# Patient Record
Sex: Male | Born: 1935 | Race: White | Hispanic: No | Marital: Married | Smoking: Former smoker
Health system: Southern US, Community
[De-identification: ages and names within clinical notes are randomized; demographics above are authoritative.]

## PROBLEM LIST (undated history)

## (undated) DIAGNOSIS — I350 Nonrheumatic aortic (valve) stenosis: Secondary | ICD-10-CM

## (undated) DIAGNOSIS — T7840XA Allergy, unspecified, initial encounter: Secondary | ICD-10-CM

## (undated) DIAGNOSIS — I10 Essential (primary) hypertension: Secondary | ICD-10-CM

## (undated) DIAGNOSIS — G20 Parkinson's disease: Secondary | ICD-10-CM

## (undated) DIAGNOSIS — I119 Hypertensive heart disease without heart failure: Secondary | ICD-10-CM

## (undated) DIAGNOSIS — I270 Primary pulmonary hypertension: Secondary | ICD-10-CM

## (undated) DIAGNOSIS — R269 Unspecified abnormalities of gait and mobility: Secondary | ICD-10-CM

## (undated) DIAGNOSIS — H269 Unspecified cataract: Secondary | ICD-10-CM

## (undated) DIAGNOSIS — K5730 Diverticulosis of large intestine without perforation or abscess without bleeding: Secondary | ICD-10-CM

## (undated) DIAGNOSIS — R55 Syncope and collapse: Secondary | ICD-10-CM

## (undated) DIAGNOSIS — C801 Malignant (primary) neoplasm, unspecified: Secondary | ICD-10-CM

## (undated) DIAGNOSIS — I2510 Atherosclerotic heart disease of native coronary artery without angina pectoris: Secondary | ICD-10-CM

## (undated) DIAGNOSIS — E785 Hyperlipidemia, unspecified: Secondary | ICD-10-CM

## (undated) DIAGNOSIS — Z9889 Other specified postprocedural states: Secondary | ICD-10-CM

## (undated) DIAGNOSIS — Z5189 Encounter for other specified aftercare: Secondary | ICD-10-CM

## (undated) DIAGNOSIS — J189 Pneumonia, unspecified organism: Secondary | ICD-10-CM

## (undated) DIAGNOSIS — G20C Parkinsonism, unspecified: Secondary | ICD-10-CM

## (undated) DIAGNOSIS — K635 Polyp of colon: Secondary | ICD-10-CM

## (undated) DIAGNOSIS — M1990 Unspecified osteoarthritis, unspecified site: Secondary | ICD-10-CM

## (undated) HISTORY — DX: Atherosclerotic heart disease of native coronary artery without angina pectoris: I25.10

## (undated) HISTORY — PX: EYE SURGERY: SHX253

## (undated) HISTORY — DX: Primary pulmonary hypertension: I27.0

## (undated) HISTORY — PX: CORONARY ARTERY BYPASS GRAFT: SHX141

## (undated) HISTORY — PX: PTCA: SHX146

## (undated) HISTORY — DX: Unspecified cataract: H26.9

## (undated) HISTORY — DX: Parkinsonism, unspecified: G20.C

## (undated) HISTORY — DX: Unspecified abnormalities of gait and mobility: R26.9

## (undated) HISTORY — DX: Encounter for other specified aftercare: Z51.89

## (undated) HISTORY — DX: Nonrheumatic aortic (valve) stenosis: I35.0

## (undated) HISTORY — DX: Diverticulosis of large intestine without perforation or abscess without bleeding: K57.30

## (undated) HISTORY — DX: Polyp of colon: K63.5

## (undated) HISTORY — DX: Syncope and collapse: R55

## (undated) HISTORY — DX: Essential (primary) hypertension: I10

## (undated) HISTORY — DX: Hyperlipidemia, unspecified: E78.5

## (undated) HISTORY — DX: Other specified postprocedural states: Z98.890

## (undated) HISTORY — DX: Parkinson's disease: G20

## (undated) HISTORY — DX: Hypertensive heart disease without heart failure: I11.9

## (undated) HISTORY — PX: LAPAROSCOPIC CHOLECYSTECTOMY: SUR755

## (undated) HISTORY — DX: Allergy, unspecified, initial encounter: T78.40XA

---

## 1978-05-14 HISTORY — PX: CORONARY ARTERY BYPASS GRAFT: SHX141

## 1997-08-12 ENCOUNTER — Encounter (HOSPITAL_COMMUNITY): Admission: RE | Admit: 1997-08-12 | Discharge: 1997-11-10 | Payer: Self-pay | Admitting: Cardiology

## 1997-11-12 ENCOUNTER — Encounter (HOSPITAL_COMMUNITY): Admission: RE | Admit: 1997-11-12 | Discharge: 1998-02-10 | Payer: Self-pay | Admitting: Cardiology

## 1997-12-06 ENCOUNTER — Observation Stay (HOSPITAL_COMMUNITY): Admission: AD | Admit: 1997-12-06 | Discharge: 1997-12-07 | Payer: Self-pay | Admitting: Cardiology

## 1998-02-11 ENCOUNTER — Encounter (HOSPITAL_COMMUNITY): Admission: RE | Admit: 1998-02-11 | Discharge: 1998-05-12 | Payer: Self-pay | Admitting: Cardiology

## 1998-05-13 ENCOUNTER — Encounter (HOSPITAL_COMMUNITY): Admission: RE | Admit: 1998-05-13 | Discharge: 1998-08-11 | Payer: Self-pay | Admitting: Cardiology

## 1998-08-12 ENCOUNTER — Encounter (HOSPITAL_COMMUNITY): Admission: RE | Admit: 1998-08-12 | Discharge: 1998-11-10 | Payer: Self-pay | Admitting: Cardiology

## 1998-11-11 ENCOUNTER — Encounter (HOSPITAL_COMMUNITY): Admission: RE | Admit: 1998-11-11 | Discharge: 1998-12-12 | Payer: Self-pay | Admitting: Cardiology

## 1998-12-13 ENCOUNTER — Encounter (HOSPITAL_COMMUNITY): Admission: RE | Admit: 1998-12-13 | Discharge: 1999-03-13 | Payer: Self-pay | Admitting: Cardiology

## 1998-12-28 ENCOUNTER — Encounter: Admission: RE | Admit: 1998-12-28 | Discharge: 1999-03-28 | Payer: Self-pay | Admitting: Orthopedic Surgery

## 2000-06-18 ENCOUNTER — Ambulatory Visit (HOSPITAL_BASED_OUTPATIENT_CLINIC_OR_DEPARTMENT_OTHER): Admission: RE | Admit: 2000-06-18 | Discharge: 2000-06-18 | Payer: Self-pay | Admitting: General Surgery

## 2000-06-18 ENCOUNTER — Encounter (INDEPENDENT_AMBULATORY_CARE_PROVIDER_SITE_OTHER): Payer: Self-pay | Admitting: Specialist

## 2003-09-13 ENCOUNTER — Ambulatory Visit (HOSPITAL_COMMUNITY): Admission: RE | Admit: 2003-09-13 | Discharge: 2003-09-13 | Payer: Self-pay | Admitting: Internal Medicine

## 2005-03-27 ENCOUNTER — Ambulatory Visit: Payer: Self-pay

## 2005-03-27 ENCOUNTER — Ambulatory Visit: Payer: Self-pay | Admitting: Cardiology

## 2005-04-11 ENCOUNTER — Ambulatory Visit: Payer: Self-pay | Admitting: Cardiology

## 2005-05-23 ENCOUNTER — Ambulatory Visit: Payer: Self-pay | Admitting: Cardiology

## 2005-08-30 ENCOUNTER — Ambulatory Visit: Payer: Self-pay | Admitting: Internal Medicine

## 2005-09-12 ENCOUNTER — Ambulatory Visit: Payer: Self-pay | Admitting: Internal Medicine

## 2005-09-12 ENCOUNTER — Encounter (INDEPENDENT_AMBULATORY_CARE_PROVIDER_SITE_OTHER): Payer: Self-pay | Admitting: Specialist

## 2006-04-17 ENCOUNTER — Ambulatory Visit: Payer: Self-pay | Admitting: Cardiology

## 2006-04-17 ENCOUNTER — Ambulatory Visit: Payer: Self-pay

## 2006-05-23 ENCOUNTER — Ambulatory Visit: Payer: Self-pay | Admitting: Cardiology

## 2007-06-02 ENCOUNTER — Ambulatory Visit: Payer: Self-pay

## 2007-06-02 LAB — CONVERTED CEMR LAB
AST: 23 units/L (ref 0–37)
Alkaline Phosphatase: 84 units/L (ref 39–117)
BUN: 29 mg/dL — ABNORMAL HIGH (ref 6–23)
Bilirubin, Direct: 0.3 mg/dL (ref 0.0–0.3)
CO2: 29 meq/L (ref 19–32)
Cholesterol: 109 mg/dL (ref 0–200)
Creatinine, Ser: 1.3 mg/dL (ref 0.4–1.5)
GFR calc Af Amer: 68 mL/min
HDL: 36.1 mg/dL — ABNORMAL LOW (ref >39.0)
Potassium: 4.6 meq/L (ref 3.5–5.1)
Sodium: 140 meq/L (ref 135–145)

## 2007-06-09 ENCOUNTER — Ambulatory Visit: Payer: Self-pay | Admitting: Cardiology

## 2007-06-16 ENCOUNTER — Ambulatory Visit: Payer: Self-pay

## 2007-06-16 ENCOUNTER — Encounter: Payer: Self-pay | Admitting: Cardiology

## 2008-01-07 ENCOUNTER — Ambulatory Visit: Payer: Self-pay | Admitting: Cardiology

## 2008-02-10 ENCOUNTER — Ambulatory Visit: Payer: Self-pay | Admitting: Cardiology

## 2008-02-24 ENCOUNTER — Ambulatory Visit: Payer: Self-pay | Admitting: Cardiology

## 2008-08-23 ENCOUNTER — Telehealth: Payer: Self-pay | Admitting: Cardiology

## 2008-08-24 ENCOUNTER — Telehealth (INDEPENDENT_AMBULATORY_CARE_PROVIDER_SITE_OTHER): Payer: Self-pay | Admitting: *Deleted

## 2008-09-13 ENCOUNTER — Telehealth: Payer: Self-pay | Admitting: Cardiology

## 2008-09-15 ENCOUNTER — Telehealth: Payer: Self-pay | Admitting: Cardiology

## 2008-09-22 ENCOUNTER — Telehealth (INDEPENDENT_AMBULATORY_CARE_PROVIDER_SITE_OTHER): Payer: Self-pay | Admitting: *Deleted

## 2008-09-23 ENCOUNTER — Ambulatory Visit: Payer: Self-pay

## 2008-09-23 ENCOUNTER — Ambulatory Visit: Payer: Self-pay | Admitting: Cardiology

## 2008-09-23 DIAGNOSIS — I119 Hypertensive heart disease without heart failure: Secondary | ICD-10-CM | POA: Insufficient documentation

## 2008-09-23 DIAGNOSIS — I272 Pulmonary hypertension, unspecified: Secondary | ICD-10-CM | POA: Insufficient documentation

## 2008-09-23 LAB — CONVERTED CEMR LAB
ALT: 29 units/L (ref 0–53)
Albumin: 3.8 g/dL (ref 3.5–5.2)
Alkaline Phosphatase: 94 units/L (ref 39–117)
Cholesterol: 108 mg/dL (ref 0–200)
Total Bilirubin: 1.6 mg/dL — ABNORMAL HIGH (ref 0.3–1.2)
VLDL: 16.8 mg/dL (ref 0.0–40.0)

## 2008-09-28 DIAGNOSIS — Z860100 Personal history of colon polyps, unspecified: Secondary | ICD-10-CM | POA: Insufficient documentation

## 2008-09-28 DIAGNOSIS — Z9089 Acquired absence of other organs: Secondary | ICD-10-CM

## 2008-09-28 DIAGNOSIS — Z8719 Personal history of other diseases of the digestive system: Secondary | ICD-10-CM

## 2008-09-28 DIAGNOSIS — Z951 Presence of aortocoronary bypass graft: Secondary | ICD-10-CM | POA: Insufficient documentation

## 2008-09-28 DIAGNOSIS — E785 Hyperlipidemia, unspecified: Secondary | ICD-10-CM | POA: Insufficient documentation

## 2008-09-28 DIAGNOSIS — I10 Essential (primary) hypertension: Secondary | ICD-10-CM | POA: Insufficient documentation

## 2008-09-28 DIAGNOSIS — Z9861 Coronary angioplasty status: Secondary | ICD-10-CM

## 2008-09-29 ENCOUNTER — Ambulatory Visit: Payer: Self-pay | Admitting: Cardiology

## 2008-09-29 DIAGNOSIS — R42 Dizziness and giddiness: Secondary | ICD-10-CM | POA: Insufficient documentation

## 2009-01-24 ENCOUNTER — Telehealth: Payer: Self-pay | Admitting: Cardiology

## 2009-01-28 ENCOUNTER — Encounter: Payer: Self-pay | Admitting: Cardiology

## 2009-02-23 ENCOUNTER — Encounter: Admission: RE | Admit: 2009-02-23 | Discharge: 2009-02-24 | Payer: Self-pay | Admitting: Neurology

## 2009-02-23 ENCOUNTER — Encounter: Payer: Self-pay | Admitting: Cardiology

## 2009-02-24 ENCOUNTER — Telehealth: Payer: Self-pay | Admitting: Cardiology

## 2009-03-03 ENCOUNTER — Telehealth: Payer: Self-pay | Admitting: Cardiology

## 2009-03-10 ENCOUNTER — Ambulatory Visit: Payer: Self-pay | Admitting: Cardiology

## 2009-03-10 DIAGNOSIS — I498 Other specified cardiac arrhythmias: Secondary | ICD-10-CM | POA: Insufficient documentation

## 2009-04-22 ENCOUNTER — Telehealth: Payer: Self-pay | Admitting: Cardiology

## 2009-05-18 ENCOUNTER — Encounter: Payer: Self-pay | Admitting: Cardiology

## 2009-05-31 ENCOUNTER — Encounter: Payer: Self-pay | Admitting: Cardiology

## 2009-06-06 ENCOUNTER — Encounter: Payer: Self-pay | Admitting: Cardiology

## 2009-07-05 ENCOUNTER — Encounter: Admission: RE | Admit: 2009-07-05 | Discharge: 2009-10-03 | Payer: Self-pay | Admitting: Neurology

## 2009-07-12 ENCOUNTER — Encounter: Payer: Self-pay | Admitting: Cardiology

## 2009-07-18 ENCOUNTER — Telehealth: Payer: Self-pay | Admitting: Cardiology

## 2009-07-20 ENCOUNTER — Emergency Department (HOSPITAL_COMMUNITY): Admission: EM | Admit: 2009-07-20 | Discharge: 2009-07-20 | Payer: Self-pay | Admitting: Emergency Medicine

## 2009-07-25 ENCOUNTER — Encounter: Payer: Self-pay | Admitting: Cardiology

## 2009-09-07 ENCOUNTER — Telehealth: Payer: Self-pay | Admitting: Cardiology

## 2010-01-24 ENCOUNTER — Encounter: Admission: RE | Admit: 2010-01-24 | Discharge: 2010-01-24 | Payer: Self-pay | Admitting: Emergency Medicine

## 2010-02-20 ENCOUNTER — Encounter: Payer: Self-pay | Admitting: Cardiology

## 2010-04-18 ENCOUNTER — Encounter: Admission: RE | Admit: 2010-04-18 | Discharge: 2010-04-18 | Payer: Self-pay | Admitting: Neurology

## 2010-04-18 ENCOUNTER — Encounter: Payer: Self-pay | Admitting: Cardiology

## 2010-06-13 NOTE — Letter (Signed)
Summary: [facility] Office Note  [facility] Office Note   Imported By: Roderic Ovens 06/28/2009 13:44:35  _____________________________________________________________________  External Attachment:    Type:   Image     Comment:   External Document

## 2010-06-13 NOTE — Procedures (Signed)
Summary: [facility]  [facility]   Imported By: Roderic Ovens 11/02/2009 13:23:01  _____________________________________________________________________  External Attachment:    Type:   Image     Comment:   External Document

## 2010-06-13 NOTE — Letter (Signed)
Summary: [facility] Note  [facility] Note   Imported By: Roderic Ovens 09/01/2009 10:40:55  _____________________________________________________________________  External Attachment:    Type:   Image     Comment:   External Document

## 2010-06-13 NOTE — Progress Notes (Signed)
Summary:  finger tip blue   Phone Note From Other Clinic Call back at [facility] Phone [phone]   Caller: dana from [facility] [phone] Request: Talk with Nurse Details for Reason: Finger tips turn complete blue while walking. pt was at rehab today.  Initial call taken by: Lorne Skeens,  July 18, 2009 1:00 PM  Follow-up for Phone Call        Left message for Annabelle Harman to call us back. She was with a pt and was unavailable. Duncan Dull, RN, BSN  July 18, 2009 1:32 PM   Additional Follow-up for Phone Call Additional follow up Details #1::        S/W Annabelle Harman, she is working with the pt at the request of Neurology for balance issues. She has noticed that when he is up walking around the tips of his fingers and nailbeds turn completely blue until he sits down. It takes 2-3 mins for them to return to baseline color which is not completely normal either. Pt told her that he sees Dr. Juanda Chance for circulatory issues so she wanted to pass this info on to him. She is aware that he is out of the office today and I will pass this along to his Nurse Herbert Seta when she returns tomorrow.   I discussed the above with Dr. Juanda Chance. He states this could be Raynaud's but feels it is not urgent. I will call Dana/ the pt back tomorrow.  Additional Follow-up by: Sherri Rad, RN, BSN,  July 19, 2009 5:09 PM    Additional Follow-up for Phone Call Additional follow up Details #2::    I called today and spoke with the pt's wife. She states the pt went back to neuro rehab today and his fingers did turn blue again- they were actually worse today. Dr. Chestine Spore was contacted and the pt was sent to the [facility] ER for some testing to be done. He was found to be dehydrated and recieved 2 bags of fluid. Per Mrs. Hustead, the pt's electrolytes and hemoglobin were normal. The pt did have orthostatic hypotension. He does not have discoloration to his toes. He is to f/u with Dr. Chestine Spore on monday 3/14. I explained to the pt's wife that I  would make Dr. Juanda Chance aware of this and advised her to have Dr. Chestine Spore contact us if needed. Mrs. Hartsell verbalizes understanding. Follow-up by: Sherri Rad, RN, BSN,  July 20, 2009 3:45 PM

## 2010-06-13 NOTE — Letter (Signed)
Summary: [facility] Office Note  [facility] Office Note   Imported By: Roderic Ovens 08/10/2009 16:31:33  _____________________________________________________________________  External Attachment:    Type:   Image     Comment:   External Document

## 2010-06-13 NOTE — Progress Notes (Signed)
Summary: PA- Vytorin  Phone Note Call from Patient   Caller: Spouse Summary of Call: Mrs. Skelly called today stating the pt needed Vytorin 10/40mg  refilled, but needed a PA for this. I called [facility] on [address] and was given the # [phone] to call. I called and spoke with the coverage review pharmacist and was given a case # of [id number]. They first wanted to deny coverage b/c there was no record in EMR of failed therapy on another statin. I pulled the pt's paper chart and called them back to let them know the pt had failed lovastatin. The pt's Vytorin is approved for a year. The pt's wife and Psychologist, [facility] on [address] are aware. Initial call taken by: Sherri Rad, RN, BSN,  September 07, 2009 4:05 PM

## 2010-06-13 NOTE — Medication Information (Signed)
Summary: [facility] Prescription Notification  [facility] Prescription Notification   Imported By: Roderic Ovens 10/11/2009 15:24:48  _____________________________________________________________________  External Attachment:    Type:   Image     Comment:   External Document

## 2010-06-15 NOTE — Letter (Signed)
Summary: [facility] Office Visit Note   [facility] Office Visit Note   Imported By: Roderic Ovens 06/01/2010 15:12:22  _____________________________________________________________________  External Attachment:    Type:   Image     Comment:   External Document

## 2010-07-07 ENCOUNTER — Telehealth: Payer: Self-pay | Admitting: Cardiology

## 2010-07-11 NOTE — Progress Notes (Signed)
Summary: BLOOD WORK ORDERS  Phone Note Call from Patient   Caller: Patient Reason for Call: Talk to Nurse Summary of Call: PT WIFE STATES PT NEEDS AND WANTS TO HAVE BLOOD WORK DONE BEFORE HIS APPT WITH DR, Jens Som ON 08-10-10 Initial call taken by: Roe Coombs,  July 07, 2010 11:38 AM  Follow-up for Phone Call        spoke with pt wife, labs ordered at the [place] [facility] Deliah Goody, RN  July 07, 2010 3:08 PM

## 2010-07-27 ENCOUNTER — Encounter: Payer: Self-pay | Admitting: Cardiology

## 2010-08-04 LAB — CBC
Hemoglobin: 12.7 g/dL — ABNORMAL LOW (ref 13.0–17.0)
Platelets: 103 10*3/uL — ABNORMAL LOW (ref 150–400)
RBC: 3.81 MIL/uL — ABNORMAL LOW (ref 4.22–5.81)
RDW: 13.8 % (ref 11.5–15.5)
WBC: 7.6 10*3/uL (ref 4.0–10.5)

## 2010-08-04 LAB — DIFFERENTIAL
Basophils Absolute: 0 10*3/uL (ref 0.0–0.1)
Basophils Relative: 0 % (ref 0–1)
Eosinophils Absolute: 0 10*3/uL (ref 0.0–0.7)
Eosinophils Relative: 0 % (ref 0–5)
Monocytes Absolute: 0.6 10*3/uL (ref 0.1–1.0)

## 2010-08-04 LAB — POCT I-STAT, CHEM 8
BUN: 31 mg/dL — ABNORMAL HIGH (ref 6–23)
Creatinine, Ser: 1.2 mg/dL (ref 0.4–1.5)
Glucose, Bld: 91 mg/dL (ref 70–99)
HCT: 37 % — ABNORMAL LOW (ref 39.0–52.0)

## 2010-08-08 ENCOUNTER — Other Ambulatory Visit: Payer: Self-pay | Admitting: *Deleted

## 2010-08-08 ENCOUNTER — Other Ambulatory Visit (INDEPENDENT_AMBULATORY_CARE_PROVIDER_SITE_OTHER): Payer: 59

## 2010-08-08 DIAGNOSIS — I10 Essential (primary) hypertension: Secondary | ICD-10-CM

## 2010-08-08 DIAGNOSIS — Z79899 Other long term (current) drug therapy: Secondary | ICD-10-CM

## 2010-08-08 DIAGNOSIS — E7800 Pure hypercholesterolemia, unspecified: Secondary | ICD-10-CM

## 2010-08-08 LAB — HEPATIC FUNCTION PANEL
Bilirubin, Direct: 0.2 mg/dL (ref 0.0–0.3)
Total Bilirubin: 1.6 mg/dL — ABNORMAL HIGH (ref 0.3–1.2)

## 2010-08-08 LAB — LIPID PANEL
Cholesterol: 114 mg/dL (ref 0–200)
LDL Cholesterol: 56 mg/dL (ref 0–99)
Total CHOL/HDL Ratio: 3
Triglycerides: 76 mg/dL (ref 0.0–149.0)
VLDL: 15.2 mg/dL (ref 0.0–40.0)

## 2010-08-08 LAB — BASIC METABOLIC PANEL
BUN: 29 mg/dL — ABNORMAL HIGH (ref 6–23)
Calcium: 9.4 mg/dL (ref 8.4–10.5)
Creatinine, Ser: 1.1 mg/dL (ref 0.4–1.5)

## 2010-08-10 ENCOUNTER — Encounter: Payer: Self-pay | Admitting: Cardiology

## 2010-08-10 ENCOUNTER — Encounter: Payer: Self-pay | Admitting: *Deleted

## 2010-08-25 ENCOUNTER — Other Ambulatory Visit: Payer: Self-pay

## 2010-09-07 ENCOUNTER — Encounter: Payer: Self-pay | Admitting: Cardiology

## 2010-09-11 ENCOUNTER — Telehealth: Payer: Self-pay | Admitting: Cardiology

## 2010-09-11 NOTE — Telephone Encounter (Signed)
Spoke with pt wife, pt would prefer to stay on vytorin. [facility] to fax over the number. Samples of vytorin given Deliah Goody

## 2010-09-11 NOTE — Telephone Encounter (Signed)
Called VYTORIN  into pharmacy gave pt refills will route this to [facility] pts wife had a question. Does the doctor still want him on this med even though its not covered by insurance

## 2010-09-11 NOTE — Telephone Encounter (Signed)
Pt wife states pt needs [facility]orin to [facility] [address] [phone]. Pt wife states insurance will not cover meds unless it called in to the insurance by the nurse. If nurse not able to call insurance Pt wife also wants to know if the dr wants to change meds since its not cover by insurance.

## 2010-09-19 ENCOUNTER — Ambulatory Visit (INDEPENDENT_AMBULATORY_CARE_PROVIDER_SITE_OTHER): Payer: 59 | Admitting: Cardiology

## 2010-09-19 ENCOUNTER — Encounter: Payer: Self-pay | Admitting: Cardiology

## 2010-09-19 DIAGNOSIS — I059 Rheumatic mitral valve disease, unspecified: Secondary | ICD-10-CM

## 2010-09-19 DIAGNOSIS — R42 Dizziness and giddiness: Secondary | ICD-10-CM

## 2010-09-19 DIAGNOSIS — I340 Nonrheumatic mitral (valve) insufficiency: Secondary | ICD-10-CM | POA: Insufficient documentation

## 2010-09-19 DIAGNOSIS — I253 Aneurysm of heart: Secondary | ICD-10-CM

## 2010-09-19 DIAGNOSIS — I10 Essential (primary) hypertension: Secondary | ICD-10-CM

## 2010-09-19 NOTE — Assessment & Plan Note (Signed)
Continue statin. Recent lipids and liver outstanding. 

## 2010-09-19 NOTE — Patient Instructions (Addendum)
Your physician recommends that you schedule a follow-up appointment in: YEAR WITH DR Jens Som  Your physician recommends that you continue on your current medications as directed. Please refer to the Current Medication list given to you today.  Your physician has requested that you have an echocardiogram. Echocardiography is a painless test that uses sound waves to create images of your heart. It provides your doctor with information about the size and shape of your heart and how well your heart's chambers and valves are working. This procedure takes approximately one hour. There are no restrictions for this procedure. AT PT'S CONVENIENCE DX MR

## 2010-09-19 NOTE — Assessment & Plan Note (Signed)
Repeat echocardiogram for MR and history of mild aortic stenosis.

## 2010-09-19 NOTE — Assessment & Plan Note (Signed)
Continue aspirin and statin. 

## 2010-09-19 NOTE — Assessment & Plan Note (Signed)
We will allow blood pressure to run slightly higher to avoid postural drops.

## 2010-09-19 NOTE — Assessment & Plan Note (Signed)
Blood pressure mildly elevated. I will not increase or add medications as he had some dizziness with standing and a history of orthostatic hypotension.

## 2010-09-19 NOTE — Progress Notes (Signed)
HPI: Pleasant 77 yo male previously followed by Dr. Juanda Chance for FU CAD (bypass surgery in 1980). He later had PCI of the circumflex artery. He's done quite well from the standpoint of his heart with no recent chest pain shortness breath or palpitations. He has hypertension and also has had postural hypotension. He also has chronic symptoms of dizziness. He was seen by Dr. Mikey Bussing  who thought his dizziness was multifactorial and recommended that he could dissipate in the neurology rehabilitation program. Echocardiogram in February of 2009 showed normal LV function, mild aortic stenosis, trace aortic insufficiency, mild mitral valve prolapse question perforation and mild to moderate mitral regurgitation and mild to moderate tricuspid regurgitation. Last Myoview in May of 2010 showed an ejection fraction of 60% and no ischemia or infarction. Since he was last seen, the patient denies any dyspnea on exertion, orthopnea, PND, pedal edema, palpitations, syncope or chest pain.   Current Outpatient Prescriptions  Medication Sig Dispense Refill  . aspirin 325 MG tablet Take 325 mg by mouth daily.        [name] donepezil (ARICEPT) 10 MG tablet Take 10 mg by mouth daily.        [name] ezetimibe-simvastatin (VYTORIN) 10-40 MG per tablet Take 1 tablet by mouth at bedtime.        . Multiple Vitamins-Minerals (CENTRUM PO) 1 tab po qd       . DISCONTD: amLODipine (NORVASC) 5 MG tablet Take 5 mg by mouth daily.        [name] DISCONTD: losartan (COZAAR) 25 MG tablet Take 25 mg by mouth daily.        [name] DISCONTD: losartan (COZAAR) 50 MG tablet Take 50 mg by mouth daily.           Past Medical History  Diagnosis Date  . Colonic polyp   . Diverticulosis of colon   . HLD (hyperlipidemia)   . HTN (hypertension)   . Primary pulmonary HTN   . Benign hypertensive heart disease without heart failure   . CAD (coronary artery disease)     Past Surgical History  Procedure Date  . Coronary artery bypass graft 1980  . Laparoscopic  cholecystectomy   . Ptca     History   Social History  . Marital Status: Married    Spouse Name: N/A    Number of Children: 2  . Years of Education: N/A   Occupational History  . retired    Social History Main Topics  . Smoking status: Former Games developer  . Smokeless tobacco: Not on file  . Alcohol Use: Not on file  . Drug Use: Not on file  . Sexually Active: Not on file   Other Topics Concern  . Not on file   Social History Narrative  . No narrative on file    ROS: no fevers or chills, productive cough, hemoptysis, dysphasia, odynophagia, melena, hematochezia, dysuria, hematuria, rash, seizure activity, orthopnea, PND, pedal edema, claudication. Remaining systems are negative.  Physical Exam: Well-developed well-nourished in no acute distress.  Skin is warm and dry.  HEENT is normal.  Neck is supple. No thyromegaly.  Chest is clear to auscultation with normal expansion.  Cardiovascular exam is regular rate and rhythm. 2/6 systolic murmur at the apex. Abdominal exam nontender or distended. No masses palpated. Extremities show no edema. neuro grossly intact  ECG Sinus bradycardia at a rate of 55. First degree AV block. Nonspecific ST changes.

## 2010-09-26 NOTE — Assessment & Plan Note (Signed)
[facility]                            CARDIOLOGY OFFICE NOTE   Arthur Lane, HELT                     MRN:          [id number]  DATE:06/09/2007                            DOB:          02-24-1924    PRIMARY CARE PHYSICIAN:  Margaretmary Bayley.   CLINICAL HISTORY:  Arthur Lane is 77 years old and had coronary bypass  surgery in 1980.  He subsequently had PTCA of his circumflex artery in  1996 and 1999.  He has done quite well since that time.  He has been  exercising on his treadmill at home on a regular basis.  He had a  Myoview scan prior to this visit which showed no evidence of ischemia  and an ejection fraction of 62%.  This with an adenosine scan.   He also has hypertension which has been markedly elevated in the office  but normal by his home readings in the range of 120-130 systolic.  We  had started him on Norvasc with his high pressures recently, but his  pressure went down to 98 on his home readings so he took only one dose.   PAST MEDICAL HISTORY:  1. Hyperlipidemia.  2. Hypertension.   CURRENT MEDICATIONS:  Aspirin, Vytorin, metoprolol, Cozaar.   EXAMINATION:  The blood pressure was 238/99 and the pulse 53 and  regular.  There was no venous distension.  The carotid pulses were full  without bruits.  CHEST:  Clear without rales or rhonchi.  CARDIAC:  Rhythm was regular.  There was a mid to late systolic murmur  at the apex.  ABDOMEN:  Soft with normal bowel sounds.  There is no  hepatosplenomegaly.  Peripheral pulses are full with no peripheral edema.   Recent lipid profile was excellent except for an HDL of __________.   IMPRESSION:  1. Coronary artery disease status post coronary bypass graft surgery      in 1980 and status post percutaneous coronary intervention in 1996      and  1999 to the circumflex artery now stable with a nonischemic      Myoview scan.  2. Hypertension, markedly elevated in the office with normal  readings      at home.  3. Hyperlipidemia.  4. Good left ventricular function.   RECOMMENDATIONS:  I think Arthur Lane is doing well from his heart.  His blood pressure elevations are of concern.  Will plan to have him  bring his blood pressure cuff at home into our office and see if the  blood pressures correlate.  Will also get an echocardiogram to see if  there is LVH.  If his blood pressures do not correlate with his home  cuff, or if he has LVH then I think we will pursue with Norvasc 5 mg.  We may want to cut his Cozaar back to 25 when we do that to keep his  blood pressure from getting too low.  With recent studies showing the  benefit of combination of ACE or ARB with calcium channel blockers in  reducing events, I think I have a low threshold for proceeding  with  this.     Bruce Elvera Lennox Juanda Chance, MD, [facility]  Electronically Signed    BRB/MedQ  DD: 06/09/2007  DT: 06/09/2007  Job #: [id number]

## 2010-09-26 NOTE — Assessment & Plan Note (Signed)
[facility]                            CARDIOLOGY OFFICE NOTE   RUSSELL, QUINNEY                     MRN:          [id number]  DATE:01/07/2008                            DOB:          12/11/1923    REFERRING PHYSICIAN:  Margaretmary Bayley, M.D.   REASON FOR REFERRAL:  Evaluation of lightheadedness and management of  refractory hypertension.   CLINICAL HISTORY:  Mr. Coye is an 77 year old and was referred by  Dr. Chestine Spore because of symptoms of lightheadedness and also because of  problems managing his blood pressure.  He has documented coronary  disease and coronary bypass graft surgery in 1980 and he subsequently  had PTCA of circumflex artery in 1996 and 1999.  His last  catheterization, he has had a nonischemic Myoview scan in January.   He has had hypertension and his blood pressures have always been quite  high when he comes to the office, but by his home readings have been in  normal range of 120-130.  We did an echocardiogram in January to see if  there is any evidence of LVH and there was not.  He brought his home  cuff in today, so that, we could compare with our measurements here in  the office.   He also has had symptoms of lightheadedness, which he defines feeling  lightheaded, but not quite presyncopal.  He has no associated nausea,  diaphoresis, chest pain, or palpitations with these symptoms and he has  no focal neurological signs with these symptoms.   His past medical history is significant for hypertension and  hyperlipidemia.   His current medications include,  1. Aspirin.  2. Vytorin.  3. Metoprolol ER 50 mg daily.  4. Amlodipine 5 mg daily.  5. Cozaar 50 mg daily.   SOCIAL HISTORY:  Mr. Mohs is married.  He does not smoke or drink.   FAMILY HISTORY:  Noncontributory.   REVIEW OF SYSTEMS:  Positive for the lightheadedness.   PHYSICAL EXAMINATION:  VITAL SIGNS:  On examination, the blood pressure  is 187/87,  the pulse 54 and regular.  We checked it with his cuff and it  was 188/100.  There was no venous tension.  The carotid pulses were full  and there were no bruits.  CHEST:  Clear.  HEART:  Cardiac rhythm was regular.  Heart sounds were normal.  I could  hear no murmurs or gallops.  ABDOMEN:  Soft with normal bowel sounds.  There is no  hepatosplenomegaly.  EXTREMITIES:  Peripheral pulses were full with no peripheral edema.  MUSCULOSKELETAL:  Showed no deformities.  SKIN:  Warm and dry.  NEUROLOGIC:  Showed no focal neurological signs.   IMPRESSION:  1. Lightheadedness of uncertain etiology.  2. Hypertension, well controlled by home readings, but elevated on      office readings.  3. Coronary artery disease status post coronary artery bypass graft      surgery in 1980 and prior percutaneous coronary interventions now      stable.  4. Hyperlipidemia.  5. Good left ventricular function.   RECOMMENDATIONS:  I feel better that  Mr. Hargens' blood pressure  readings at home are accurate and that his blood pressures are  controlled the great majority of the time.  The only time it is seen to  be elevated is when he comes to the physician's office.  I am also  reassured that he has no LVH on his echocardiography.  Based on these  findings, I think we can continue with his current blood pressure  treatment.  I am not certain regarding the etiology of his  lightheadedness.  My biggest concern is that this might be related to  bradycardia.  He is on metoprolol 50 a day and his resting pulse is in  the 50s.  We will cut him back to 25 a day.  If his symptoms persist  after a week on the lower dose metoprolol, then I have recommended that  he have an event monitor and I asked him to call within a week about  that.  I will plan to see him back in January with plan to do a Myoview  scan prior to that visit.     Bruce Elvera Lennox Juanda Chance, MD, [facility]  Electronically Signed    BRB/MedQ  DD: 01/07/2008   DT: 01/08/2008  Job #: [id number]

## 2010-09-29 NOTE — Assessment & Plan Note (Signed)
[facility]                            CARDIOLOGY OFFICE NOTE   MARQUET, FAIRCLOTH                     MRN:          [id number]  DATE:05/23/2006                            DOB:          [birth year]    May 23, 2004   PRIMARY CARE PHYSICIAN:  Margaretmary Bayley, M.D.   CLINICAL HISTORY:  Arthur Lane is 77 years old and had bypass surgery  in 1980.  He subsequently has had PTCA of the circumflex artery in 1996  and 1999.  He has done quite well since that time.  He has been active  and has had no chest pain, shortness of breath or palpitations.  He had  an adenosine Myoview scan prior to this visit which showed no evidence  of ischemia and showed normal LV function.   PAST MEDICAL HISTORY:  1. Hypertension.  2. Hyperlipidemia.   CURRENT MEDICATIONS:  1. Aspirin.  2. Vytorin.  3. Metoprolol.  4. Cozaar.   PHYSICAL EXAMINATION:  VITAL SIGNS:  Blood pressure 190/87, pulse 51 and  regular.  He indicated his blood pressure when he has checked it at home  has been in the 120s systolic.  NECK:  There was no venous distention.  Carotid pulses were full and  there were no bruits.  CHEST:  Clear without rales or rhonchi.  CARDIOVASCULAR:  Regular rhythm.  Heart sounds were normal.  No murmurs  or gallops.  ABDOMEN:  Soft with normal bowel sounds.  EXTREMITIES:  Peripheral pulses were full and there was peripheral  edema.   IMPRESSION:  1. Coronary artery disease status post coronary artery bypass graft      surgery in 1980 and status post percutaneous coronary interventions      in 1996 and 1999, now stable.  2. Hypertension.  3. Hyperlipidemia.  4. Good left ventricular function.   RECOMMENDATIONS:  I think Mr. Guia is doing quite well.  His blood  pressure is high today but it has been normal on all other occasions so  I will not change his medications.  His HDL is not quite at target and I  talked to him about a low glycemic diet to  help with his HDL.  I  will plan to see him back in a year and will plan to do labs and  adenosine Myoview scan prior to that visit.     Bruce Elvera Lennox Juanda Chance, MD, [facility]  Electronically Signed    BRB/MedQ  DD: 05/23/2006  DT: 05/23/2006  Job #: [id number]

## 2010-10-10 ENCOUNTER — Telehealth: Payer: Self-pay | Admitting: Cardiology

## 2010-10-10 NOTE — Telephone Encounter (Signed)
Called patient's wife back. She states that his insurance company will not pay for Vytorin anymore. Advised her to have pharmacy call us to provide numbers to challenge the coverage. I left samples of Vytorin 10/40  2 weeks supply to pick up.

## 2010-10-10 NOTE — Telephone Encounter (Signed)
They have a question about medications

## 2010-10-19 ENCOUNTER — Telehealth: Payer: Self-pay | Admitting: *Deleted

## 2010-10-19 NOTE — Telephone Encounter (Signed)
Called to get prior autho for pt vytorin, his insurance company will not approve the vytorin, they want the pt to take zetia and zocor separately. Left message for pt to call to discuss Deliah Goody

## 2010-10-25 NOTE — Telephone Encounter (Signed)
Discuss medication.

## 2010-10-26 ENCOUNTER — Telehealth: Payer: Self-pay | Admitting: Cardiology

## 2010-10-26 MED ORDER — SIMVASTATIN 40 MG PO TABS: 40.0000 mg | ORAL_TABLET | Freq: Every evening | ORAL | Status: DC

## 2010-10-26 MED ORDER — EZETIMIBE 10 MG PO TABS: 10.0000 mg | ORAL_TABLET | Freq: Every day | ORAL | Status: DC

## 2010-10-26 NOTE — Telephone Encounter (Signed)
Pt wife would like to talk to the nurse re pt meds. Pt wife states pt needs samples of vytorin until she able to get from her pharmacist.

## 2010-10-26 NOTE — Telephone Encounter (Signed)
Spoke with pt wife, she is aware insurance wants the pills to be separate. Scripts sent to [facility]

## 2010-10-31 ENCOUNTER — Ambulatory Visit (HOSPITAL_COMMUNITY): Payer: Medicare Other | Attending: Cardiology | Admitting: Radiology

## 2010-10-31 DIAGNOSIS — I059 Rheumatic mitral valve disease, unspecified: Secondary | ICD-10-CM

## 2010-10-31 DIAGNOSIS — I079 Rheumatic tricuspid valve disease, unspecified: Secondary | ICD-10-CM | POA: Insufficient documentation

## 2010-10-31 DIAGNOSIS — I080 Rheumatic disorders of both mitral and aortic valves: Secondary | ICD-10-CM | POA: Insufficient documentation

## 2010-10-31 DIAGNOSIS — I10 Essential (primary) hypertension: Secondary | ICD-10-CM | POA: Insufficient documentation

## 2010-10-31 DIAGNOSIS — E119 Type 2 diabetes mellitus without complications: Secondary | ICD-10-CM | POA: Insufficient documentation

## 2010-10-31 DIAGNOSIS — E785 Hyperlipidemia, unspecified: Secondary | ICD-10-CM | POA: Insufficient documentation

## 2010-10-31 DIAGNOSIS — I340 Nonrheumatic mitral (valve) insufficiency: Secondary | ICD-10-CM

## 2011-07-31 ENCOUNTER — Emergency Department (HOSPITAL_COMMUNITY): Payer: Medicare Other

## 2011-07-31 ENCOUNTER — Encounter (HOSPITAL_COMMUNITY): Payer: Self-pay

## 2011-07-31 ENCOUNTER — Other Ambulatory Visit: Payer: Self-pay

## 2011-07-31 ENCOUNTER — Inpatient Hospital Stay (HOSPITAL_COMMUNITY)
Admission: EM | Admit: 2011-07-31 | Discharge: 2011-08-07 | DRG: 193 | Disposition: A | Discharge status: Skilled Nursing Facility | Payer: Medicare Other | Attending: Family Medicine | Admitting: Family Medicine

## 2011-07-31 DIAGNOSIS — J189 Pneumonia, unspecified organism: Principal | ICD-10-CM

## 2011-07-31 DIAGNOSIS — R4182 Altered mental status, unspecified: Secondary | ICD-10-CM

## 2011-07-31 DIAGNOSIS — R269 Unspecified abnormalities of gait and mobility: Secondary | ICD-10-CM | POA: Diagnosis present

## 2011-07-31 DIAGNOSIS — I498 Other specified cardiac arrhythmias: Secondary | ICD-10-CM

## 2011-07-31 DIAGNOSIS — I491 Atrial premature depolarization: Secondary | ICD-10-CM

## 2011-07-31 DIAGNOSIS — Z951 Presence of aortocoronary bypass graft: Secondary | ICD-10-CM

## 2011-07-31 DIAGNOSIS — R5381 Other malaise: Secondary | ICD-10-CM | POA: Diagnosis present

## 2011-07-31 DIAGNOSIS — Z85828 Personal history of other malignant neoplasm of skin: Secondary | ICD-10-CM

## 2011-07-31 DIAGNOSIS — J069 Acute upper respiratory infection, unspecified: Secondary | ICD-10-CM | POA: Diagnosis present

## 2011-07-31 DIAGNOSIS — F0390 Unspecified dementia without behavioral disturbance: Secondary | ICD-10-CM | POA: Diagnosis present

## 2011-07-31 DIAGNOSIS — R42 Dizziness and giddiness: Secondary | ICD-10-CM | POA: Insufficient documentation

## 2011-07-31 DIAGNOSIS — I119 Hypertensive heart disease without heart failure: Secondary | ICD-10-CM

## 2011-07-31 DIAGNOSIS — Z87891 Personal history of nicotine dependence: Secondary | ICD-10-CM

## 2011-07-31 DIAGNOSIS — J9819 Other pulmonary collapse: Secondary | ICD-10-CM | POA: Diagnosis present

## 2011-07-31 DIAGNOSIS — E785 Hyperlipidemia, unspecified: Secondary | ICD-10-CM

## 2011-07-31 DIAGNOSIS — I2510 Atherosclerotic heart disease of native coronary artery without angina pectoris: Secondary | ICD-10-CM

## 2011-07-31 DIAGNOSIS — Z9861 Coronary angioplasty status: Secondary | ICD-10-CM

## 2011-07-31 DIAGNOSIS — G9341 Metabolic encephalopathy: Secondary | ICD-10-CM | POA: Diagnosis present

## 2011-07-31 DIAGNOSIS — Z7982 Long term (current) use of aspirin: Secondary | ICD-10-CM

## 2011-07-31 DIAGNOSIS — D696 Thrombocytopenia, unspecified: Secondary | ICD-10-CM | POA: Diagnosis present

## 2011-07-31 DIAGNOSIS — I493 Ventricular premature depolarization: Secondary | ICD-10-CM

## 2011-07-31 HISTORY — DX: Pneumonia, unspecified organism: J18.9

## 2011-07-31 HISTORY — DX: Unspecified osteoarthritis, unspecified site: M19.90

## 2011-07-31 HISTORY — DX: Malignant (primary) neoplasm, unspecified: C80.1

## 2011-07-31 LAB — COMPREHENSIVE METABOLIC PANEL
ALT: 18 U/L (ref 0–53)
Albumin: 3.4 g/dL — ABNORMAL LOW (ref 3.5–5.2)
Alkaline Phosphatase: 81 U/L (ref 39–117)
Potassium: 3.9 mEq/L (ref 3.5–5.1)
Sodium: 137 mEq/L (ref 135–145)
Total Protein: 6.3 g/dL (ref 6.0–8.3)

## 2011-07-31 LAB — CBC
Hemoglobin: 13.2 g/dL (ref 13.0–17.0)
MCHC: 33 g/dL (ref 30.0–36.0)
Platelets: 91 10*3/uL — ABNORMAL LOW (ref 150–400)
RDW: 13.3 % (ref 11.5–15.5)

## 2011-07-31 LAB — URINALYSIS, ROUTINE W REFLEX MICROSCOPIC
Bilirubin Urine: NEGATIVE
Glucose, UA: NEGATIVE mg/dL
Ketones, ur: 15 mg/dL — AB
Leukocytes, UA: NEGATIVE
Specific Gravity, Urine: 1.018 (ref 1.005–1.030)
pH: 7 (ref 5.0–8.0)

## 2011-07-31 LAB — POCT I-STAT TROPONIN I

## 2011-07-31 NOTE — ED Notes (Signed)
[id number] Ready

## 2011-07-31 NOTE — ED Notes (Signed)
Admitting MD at bedside.

## 2011-07-31 NOTE — ED Provider Notes (Signed)
History     CSN: [id number]  Arrival date & time 07/31/11  4098   First MD Initiated Contact with Patient 07/31/11 1921      Chief Complaint  Patient presents with  . Altered Mental Status    (Consider location/radiation/quality/duration/timing/severity/associated sxs/prior treatment) HPI Pt here with increased confusion described as difficulty understanding and following commands onset today, this is not new pt has h/o this and seems to have bad days and good days, today is a bad day per family but not worse than any other episodes.  Pt has difficulty walking which is not new but pt was unable to get up after sliding out of chair, pt has this at baseline and this is usually worse when trying to stand from sitting position.  Pt has had trouble with urinary incontinence for past 2 days, which is new for him.  Sx's moderate, no aggravating or alleviating factors.   Past Medical History  Diagnosis Date  . Colonic polyp   . Diverticulosis of colon   . HLD (hyperlipidemia)   . HTN (hypertension)   . Primary pulmonary HTN   . Benign hypertensive heart disease without heart failure   . CAD (coronary artery disease)     Past Surgical History  Procedure Date  . Coronary artery bypass graft 1980  . Laparoscopic cholecystectomy   . Ptca     No family history on file.  History  Substance Use Topics  . Smoking status: Former Games developer  . Smokeless tobacco: Not on file  . Alcohol Use: Yes     rarely      Review of Systems  Constitutional: Negative for fever.  Respiratory: Negative for shortness of breath.   Cardiovascular: Negative for chest pain.  Gastrointestinal: Negative for abdominal pain.  Genitourinary: Negative for dysuria and difficulty urinating.  Musculoskeletal: Positive for gait problem.  Neurological: Negative for weakness, light-headedness and numbness.  Psychiatric/Behavioral: Positive for confusion and decreased concentration.  All other systems reviewed and  are negative.    Allergies  Review of patient's allergies indicates no known allergies.  Home Medications   Current Outpatient Rx  Name Route Sig Dispense Refill  . ASPIRIN EC 325 MG PO TBEC Oral Take 325 mg by mouth daily.    . DONEPEZIL HCL 10 MG PO TABS Oral Take 10 mg by mouth daily.     [name] EZETIMIBE 10 MG PO TABS Oral Take 10 mg by mouth daily.    . CENTRUM SPECIALIST PRENATAL PO Oral Take 1 capsule by mouth daily.    [name] SIMVASTATIN 40 MG PO TABS Oral Take 40 mg by mouth every evening.      BP 188/86  Pulse 88  Temp(Src) 100.5 F (38.1 C) (Oral)  Resp 20  Ht 5\' 9"  (1.753 m)  Wt 148 lb (67.132 kg)  BMI 21.86 kg/m2  SpO2 93%  Physical Exam  Nursing note and vitals reviewed. Constitutional: He appears well-developed and well-nourished.  HENT:  Head: Normocephalic and atraumatic.  Eyes: Right eye exhibits no discharge. Left eye exhibits no discharge.  Neck: Normal range of motion. Neck supple.  Cardiovascular: Normal rate, regular rhythm and normal heart sounds.   Pulmonary/Chest: Effort normal and breath sounds normal.  Abdominal: Soft. There is no tenderness.  Musculoskeletal: Normal range of motion. He exhibits no tenderness.  Neurological: He is alert. He has normal strength. No cranial nerve deficit or sensory deficit. Coordination normal. GCS eye subscore is 4. GCS verbal subscore is 5. GCS motor subscore is  6.  Skin: Skin is warm and dry.  Psychiatric: He has a normal mood and affect. His behavior is normal.    ED Course  Procedures (including critical care time)  Labs Reviewed  CBC - Abnormal; Notable for the following:    RBC 4.12 (*)    Platelets 91 (*)    All other components within normal limits  COMPREHENSIVE METABOLIC PANEL - Abnormal; Notable for the following:    BUN 25 (*)    Albumin 3.4 (*)    GFR calc non Af Amer 59 (*)    GFR calc Af Amer 68 (*)    All other components within normal limits  URINALYSIS, ROUTINE W REFLEX MICROSCOPIC -  Abnormal; Notable for the following:    Ketones, ur 15 (*)    All other components within normal limits  POCT I-STAT TROPONIN I   Dg Chest 2 View  07/31/2011  *RADIOLOGY REPORT*  Clinical Data: Altered mental status  CHEST - 2 VIEW  Comparison: None  Findings: Mild cardiac enlargement.  Prior CABG.  No effusion identified.  The right lung appears clear.  Opacity in the left base is noted which may represent infiltrate or atelectasis.  IMPRESSION:  1.  Left base opacity consistent with infiltrate and/or atelectasis.  Original Report Authenticated By: Rosealee Albee, M.D.   Ct Head Wo Contrast  07/31/2011  *RADIOLOGY REPORT*  Clinical Data: Altered mental status.  History of fall.  Expressive aphasia.  CT HEAD WITHOUT CONTRAST  Technique:  Contiguous axial images were obtained from the base of the skull through the vertex without contrast.  Comparison: CT of head 04/18/2010.  Findings: There is a background of mild cerebral and cerebellar atrophy with mild ex vacuo dilatation of the ventricular system. No definite acute intracranial abnormalities.  Specifically, no definite signs to strongly suggest acute/subacute cerebral ischemia, no focal mass, mass effect, hydrocephalus or abnormal intra or extra-axial fluid collections.  No displaced skull fractures are identified.  The appearance of the left mastoid suggests prior partial mastoidectomy.  The visualized paranasal sinuses and mastoids are otherwise well pneumatized, with the exception of a small amount of mucosal thickening in the right maxillary and bilateral ethmoid sinuses.  IMPRESSION: 1.  No acute intracranial abnormalities. 2.  Mild cerebral and cerebellar atrophy. 3.  Mild mucosal thickening in the ethmoid sinuses bilaterally and right maxillary sinus which may and relate to chronic sinus disease.  Original Report Authenticated By: Florencia Reasons, M.D.     1. Altered mental status       MDM  Pt here with ams, afvss, pt is in nad, no  code cva as pt does not have weakness or numbness in hx or on exam.  Getting labs, ct head, cxr.  Labs and imaging unremarkable, attempted to ambulate the pt and pt could not 2/2 off balance, family practice consulted for admission.          Elijio Miles, MD 08/01/11 [phone]

## 2011-07-31 NOTE — H&P (Signed)
Family Medicine Teaching [facility] Admission History and Physical  Patient name: Arthur Lane Medical record number: [id number] Date of birth: 06/09/1923 Age: 78 y.o. Gender: male  Primary Care Provider: Laurena Slimmer, MD, MD  Chief Complaint: altered mental status. History of Present Illness: Arthur Lane is a 78 y.o. male presenting with difficulty understanding and following commands that started today as well as inability to stand up from sitting position. He has Hx of dementia that I dont see it  documented, but he is actually on Aricept and his course has been with waxing and waning of his mental status "with some days better than others" per wife and son. Related to his inability to walk he usually has difficulty raising from standing position as baseline, but today had worsen to the point he slides if he tries to stand up.  Also, wife ads to the hx that he has had 2 days of non-bloody diarrhea that is improving and rhinorrhea. Denies  SOB, orthopnea, or cough. Pt denies any other symptoms including urinary. No fevers at home but at ED 100.5.     Patient Active Problem List  Diagnoses  . HYPERLIPIDEMIA  . HYPERTENSION  . BEN HTN HEART DISEASE WITHOUT HEART FAIL  . PRIMARY PULMONARY HYPERTENSION  . BRADYCARDIA  . POSTURAL LIGHTHEADEDNESS  . COLONIC POLYPS, HX OF  . DIVERTICULOSIS, COLON, HX OF  . CHOLECYSTECTOMY, LAPAROSCOPIC, HX OF  . CORONARY ARTERY BYPASS GRAFT, HX OF  . PERCUTANEOUS TRANSLUMINAL CORONARY ANGIOPLASTY, HX OF  . Mitral regurgitation   Past Medical History: Past Medical History  Diagnosis Date  . Colonic polyp   . Diverticulosis of colon   . HLD (hyperlipidemia)   . HTN (hypertension)   . Primary pulmonary HTN   . Benign hypertensive heart disease without heart failure   . CAD (coronary artery disease)    Past Surgical History: Past Surgical History  Procedure Date  . Coronary artery bypass graft 1980  . Laparoscopic cholecystectomy     . Ptca    Social History: History   Social History  . Marital Status: Married    Spouse Name: N/A    Number of Children: 2  . Years of Education: N/A   Occupational History  . retired    Social History Main Topics  . Smoking status: Former Games developer  . Smokeless tobacco: None  . Alcohol Use: Yes     rarely  . Drug Use: No  . Sexually Active: None   Other Topics Concern  . None   Social History Narrative  . None   Family History: No family history on file. Allergies: No Known Allergies No current facility-administered medications for this encounter.   Current Outpatient Prescriptions  Medication Sig Dispense Refill  . aspirin EC 325 MG tablet Take 325 mg by mouth daily.      [name] donepezil (ARICEPT) 10 MG tablet Take 10 mg by mouth daily.       [name] ezetimibe (ZETIA) 10 MG tablet Take 10 mg by mouth daily.      . Prenatal MV-Min-Fe Fum-FA-DHA (CENTRUM SPECIALIST PRENATAL PO) Take 1 capsule by mouth daily.      . simvastatin (ZOCOR) 40 MG tablet Take 40 mg by mouth every evening.        Review Of Systems:  Review of Systems  HENT: Positive for congestion.   Respiratory: Negative for cough and shortness of breath.   Cardiovascular: Negative for chest pain and palpitations.  Gastrointestinal: Positive for diarrhea. Negative for nausea  and vomiting.  Genitourinary: Negative.   Neurological: Positive for weakness.    Physical Exam: Filed Vitals:   08/01/11 0002  BP: 196/94 automatic. On ED systolics were 120's.   Pulse: 80  Temp: 97.9 F (36.6 C)  Resp: 20      Gen: no in acute distress but mildly combative.  HEENT: Moist mucous membranes. No oral lesions or exudates. Ears with normal TM bilaterally. PERRL and EOMI. No adenopathies. CV: Regular rate and rhythm, no murmurs rubs or gallops PULM: Clear to auscultation bilaterally. No wheezes/rales/rhonchi ABD: Soft, non tender, non distended, normal bowel sounds EXT: No edema. Has 3 excoriations on left upper  extremity in the process of healing. No erythema, drainage or  Neuro: Alert and oriented to person. Moderately cooperative with exam but semmed confussed with short commands. 5/5 muscular strength. Difficult to explore sensitivity and coordination. Pt has normal reflexes and good range of motion but can't stand up. Thighs become spastic.    Labs and Imaging: Lab Results  Component Value Date/Time   NA 137 07/31/2011  7:46 PM   K 3.9 07/31/2011  7:46 PM   CL 104 07/31/2011  7:46 PM   CO2 26 07/31/2011  7:46 PM   BUN 25* 07/31/2011  7:46 PM   CREATININE 1.11 07/31/2011  7:46 PM   GLUCOSE 91 07/31/2011  7:46 PM   Lab Results  Component Value Date   WBC 6.5 07/31/2011   HGB 13.2 07/31/2011   HCT 40.0 07/31/2011   MCV 97.1 07/31/2011   PLT 91* 07/31/2011   CT head  IMPRESSION:  1. No acute intracranial abnormalities.  2. Mild cerebral and cerebellar atrophy.  3. Mild mucosal thickening in the ethmoid sinuses bilaterally and  right maxillary sinus which may and relate to chronic sinus  Disease. CXR 1. Left base opacity consistent with infiltrate and/or  atelectasis.  Assessment and Plan: Arthur Lane is a 78 y.o.  male presenting with inability to walk, increase decline in his baseline dementia.  1. AMS: Pt with Hx of dementia on Aricept. Not clear what is causing this inability to walk and sudden decline of his baseline mental status. No delirium- like symptoms, no syncope like presentation, no neurologic focalization.  CT head negative. No signs or symptoms of sepsis. - observation, if fever,we will consider blood cultures and empiric treatment with antibiotic.   2. URI: rhinorrhea, low grade fever: this could potentially aggravate his baseline dementia. CXR with left base opacity questionable infiltrate or atelectasis. No cough, no SOB, negative physical exam for rales. -symptomatic tratment and monitoring.  3. Diarrhea: 2 days of diarrhea, improving. Pt w/ hx of diverticulosis.  Diarrheas were non-bloody. More likely viral GI etiology. Normal electrolytes.  - IV fluid at maintenance. If pt has good PO intake will d/c IV fluids .   4. Hx of HTN; no home meds. On ED BP were stable,although pt gets upset when the cuff inflates and tries to take it off his arm. -will continue to monitor. If BP readings are high, recheck BP manually. Dr. Jens Som is his Cardiologist will give courtesy call to update about his pt and will coordinate care if needed.  5. Hyperlipidemia: Last Lipid profile recently and WNL -Continue Zetia and Simvastatin.   6. Thrombocytopenia: Only one lab result with also low platelets (recently). - peripheral smear and Cmet am   FEN/GI : HH diet. NS at 100 ml/h and stop once pt has good PO Prophylaxis: Heparin Dispo: pending pt improvement  D. [place] Rolene Arbour PGY-1 FMTS Pager [phone]   I examined the patient with Dr. Aviva Signs . I have reviewed the note, made necessary revisions and agree with above.   In brief, 78 yo M with baseline waxing and waning dementia presents with one day of inability to stand and bilateral leg stiffness following a recent diarrheal illness x 2 days. No known sick contact. No medications changes. Had normal B12, Folate and TSH obtained by PCP in November, 2012. Physical exam within normal limits except for inability to ambulate and difficulty following commands from baseline. Also with elevated temp on admission that has resolved. Normal electrolytes, CBC, CT head. CXR significant for L base atelectasis vs infiltrate. Infiltrate possible but less likely given lack of cough or O2 requirement suggestive of pneumonia.  Assessment altered mental status following presumed viral illness in the setting of baseline dementia. Plan admit to [facility]MTS for observation. Hydrate with IVF although UA and BMET suggest normal fluid status. Continue home medications.  Follow temperatures watching closely for fever that might suggest worsening  infectious process.   Arthur Lane 08/01/11, 7:26

## 2011-07-31 NOTE — ED Notes (Signed)
Per EMS pt had AMS per family since 1530 today, fell today d/t this. On arrival EMS noted pt had expressive aphasia and difficulty understanding himself. BP 260/106, no neuro deficits or slurring of speech. 20g to LFA. Pt is confused to location.

## 2011-07-31 NOTE — ED Notes (Signed)
Attempt to call report x 1.  RN unable-to call back in 5 minutes.

## 2011-07-31 NOTE — ED Notes (Signed)
Pt unable to ambulate.  Pt very aggressive rigid.  Family at bedside-not cooperating with pt activities.  Pt becoming more confused-reports that he needs the keys and that he is going to fall.

## 2011-07-31 NOTE — ED Notes (Signed)
Pt assessed.  A/O x 4-aware of location, person, time and reason for being here.  Family at bedside.  Reports that pt has had increased urinary incontinence and falls x several months with increased in the past 2 days.  Pt fell today after trying to stand after sleeping.  Denies hitting head, denies pain.   Pt answering all questions.

## 2011-07-31 NOTE — ED Notes (Signed)
Pt changed.  Urinated on himself.  2NT and RN changed pt.  Pt very combative, uncooperative.  Pt confused.  pts wife reports that pt has been combative at night over the past several nights.

## 2011-08-01 ENCOUNTER — Inpatient Hospital Stay (HOSPITAL_COMMUNITY): Payer: Medicare Other

## 2011-08-01 DIAGNOSIS — J159 Unspecified bacterial pneumonia: Secondary | ICD-10-CM

## 2011-08-01 DIAGNOSIS — F0390 Unspecified dementia without behavioral disturbance: Secondary | ICD-10-CM

## 2011-08-01 DIAGNOSIS — Z9181 History of falling: Secondary | ICD-10-CM

## 2011-08-01 DIAGNOSIS — I2510 Atherosclerotic heart disease of native coronary artery without angina pectoris: Secondary | ICD-10-CM | POA: Diagnosis present

## 2011-08-01 DIAGNOSIS — I493 Ventricular premature depolarization: Secondary | ICD-10-CM | POA: Diagnosis present

## 2011-08-01 DIAGNOSIS — I4949 Other premature depolarization: Secondary | ICD-10-CM

## 2011-08-01 DIAGNOSIS — F05 Delirium due to known physiological condition: Secondary | ICD-10-CM

## 2011-08-01 DIAGNOSIS — I491 Atrial premature depolarization: Secondary | ICD-10-CM | POA: Diagnosis present

## 2011-08-01 LAB — COMPREHENSIVE METABOLIC PANEL
ALT: 16 U/L (ref 0–53)
AST: 22 U/L (ref 0–37)
CO2: 27 mEq/L (ref 19–32)
Chloride: 104 mEq/L (ref 96–112)
GFR calc non Af Amer: 65 mL/min — ABNORMAL LOW (ref >90)
Glucose, Bld: 98 mg/dL (ref 70–99)
Sodium: 140 mEq/L (ref 135–145)
Total Bilirubin: 1.4 mg/dL — ABNORMAL HIGH (ref 0.3–1.2)

## 2011-08-01 LAB — CBC
MCH: 31.7 pg (ref 26.0–34.0)
MCHC: 33.5 g/dL (ref 30.0–36.0)
Platelets: 79 10*3/uL — ABNORMAL LOW (ref 150–400)
RDW: 13.1 % (ref 11.5–15.5)

## 2011-08-01 LAB — PATHOLOGIST SMEAR REVIEW

## 2011-08-01 MED ORDER — ASPIRIN EC 325 MG PO TBEC
325.0000 mg | DELAYED_RELEASE_TABLET | Freq: Every day | ORAL | Status: DC
  Administered 2011-08-01: 325 mg via ORAL
  Filled 2011-08-01 (×2): qty 1

## 2011-08-01 MED ORDER — EZETIMIBE 10 MG PO TABS
10.0000 mg | ORAL_TABLET | Freq: Every day | ORAL | Status: DC
  Administered 2011-08-01 – 2011-08-06 (×6): 10 mg via ORAL
  Filled 2011-08-01 (×7): qty 1

## 2011-08-01 MED ORDER — EZETIMIBE 10 MG PO TABS
10.0000 mg | ORAL_TABLET | Freq: Every day | ORAL | Status: DC
  Filled 2011-08-01 (×3): qty 1

## 2011-08-01 MED ORDER — DONEPEZIL HCL 10 MG PO TABS
10.0000 mg | ORAL_TABLET | Freq: Every day | ORAL | Status: DC
  Filled 2011-08-01 (×3): qty 1

## 2011-08-01 MED ORDER — SIMVASTATIN 40 MG PO TABS
40.0000 mg | ORAL_TABLET | Freq: Every evening | ORAL | Status: DC
  Filled 2011-08-01: qty 1

## 2011-08-01 MED ORDER — DEXTROSE-NACL 5-0.45 % IV SOLN
INTRAVENOUS | Status: DC
  Administered 2011-08-01 – 2011-08-03 (×3): via INTRAVENOUS

## 2011-08-01 MED ORDER — DEXTROSE 5 % IV SOLN
1.0000 g | INTRAVENOUS | Status: DC
  Administered 2011-08-01 – 2011-08-02 (×2): 1 g via INTRAVENOUS
  Filled 2011-08-01 (×3): qty 10

## 2011-08-01 MED ORDER — ACETAMINOPHEN 650 MG RE SUPP: 650.0000 mg | Freq: Four times a day (QID) | RECTAL | Status: DC | PRN

## 2011-08-01 MED ORDER — ASPIRIN EC 325 MG PO TBEC
325.0000 mg | DELAYED_RELEASE_TABLET | Freq: Every day | ORAL | Status: DC
  Administered 2011-08-02 – 2011-08-06 (×5): 325 mg via ORAL
  Filled 2011-08-01 (×5): qty 1

## 2011-08-01 MED ORDER — ADULT MULTIVITAMIN W/MINERALS CH
1.0000 | ORAL_TABLET | Freq: Every day | ORAL | Status: DC
  Administered 2011-08-01 – 2011-08-07 (×7): 1 via ORAL
  Filled 2011-08-01 (×8): qty 1

## 2011-08-01 MED ORDER — AZITHROMYCIN 500 MG IV SOLR
500.0000 mg | INTRAVENOUS | Status: DC
  Administered 2011-08-01 – 2011-08-02 (×2): 500 mg via INTRAVENOUS
  Filled 2011-08-01 (×3): qty 500

## 2011-08-01 MED ORDER — HEPARIN SODIUM (PORCINE) 5000 UNIT/ML IJ SOLN
5000.0000 [IU] | Freq: Three times a day (TID) | INTRAMUSCULAR | Status: DC
  Filled 2011-08-01: qty 1

## 2011-08-01 MED ORDER — ACETAMINOPHEN 325 MG PO TABS
650.0000 mg | ORAL_TABLET | Freq: Four times a day (QID) | ORAL | Status: DC | PRN
  Administered 2011-08-01: 650 mg via ORAL
  Filled 2011-08-01: qty 2

## 2011-08-01 MED ORDER — DONEPEZIL HCL 10 MG PO TABS
10.0000 mg | ORAL_TABLET | Freq: Every day | ORAL | Status: DC
  Administered 2011-08-01 – 2011-08-06 (×6): 10 mg via ORAL
  Filled 2011-08-01 (×7): qty 1

## 2011-08-01 MED ORDER — SIMVASTATIN 40 MG PO TABS
40.0000 mg | ORAL_TABLET | Freq: Every day | ORAL | Status: DC
  Administered 2011-08-01 – 2011-08-06 (×6): 40 mg via ORAL
  Filled 2011-08-01 (×7): qty 1

## 2011-08-01 MED ORDER — POTASSIUM CHLORIDE CRYS ER 20 MEQ PO TBCR
40.0000 meq | EXTENDED_RELEASE_TABLET | ORAL | Status: AC
  Administered 2011-08-01 (×2): 40 meq via ORAL
  Filled 2011-08-01 (×2): qty 2

## 2011-08-01 MED ORDER — SODIUM CHLORIDE 0.9 % IV SOLN
INTRAVENOUS | Status: DC
  Administered 2011-08-01: 02:00:00 via INTRAVENOUS

## 2011-08-01 NOTE — H&P (Signed)
Seen and examined.  Discussed with Dr. Armen Pickup.  Agree with her documentation and management.  Briefly 78 yo male with leg weakness and altered mental status.  Issues Acute on chronic weakness.  Long diff dx.  In order of likelihood, 1. Dehydration/viral illness.  2 days of loose stools.  Likely not normal thirst with his early dementia.  Hgb did drop a little suggesting he was dehydrated. 2. Pneumonia.  Has an intermitant cough x 1 week.  Low grade fever in ER.  Questionable CXR.  If X ray worse or if fever recurs, I would start empiric antibiotics for CAP. 3. Cardiac.  He has considerable ventricular ectopy on EKG and brady with irregularity on exam.  I agree it would be reasonable to consult his cardiologist to see if this is new or old.  I would recommend another set of cardiac enzymes which will also give a total CK to R/O simvastatin induced rhabdo (very unlikely)  Agree with slow approach to work up.  He may quickly perk up with hydration.  How his clinical exam evolves will determine the intensity of the WU.

## 2011-08-01 NOTE — Progress Notes (Signed)
Called to see patient, bedside RN and family concerned regarding patient's lethargy. MD aware and ordered blood cultures and abx due to increased temp. Upon assessment patient alert, oriented to person, follows commands, elevated temp, otherwise stable vital signs. Confirmed with bedside Rn and patient's family that temp may cause increased lethargy, bedside nurse to get patient tylenol. Advised RN to call back if condition changes

## 2011-08-01 NOTE — Progress Notes (Signed)
Physical Therapy Evaluation Past Medical History  Diagnosis Date  . Colonic polyp   . Diverticulosis of colon   . HLD (hyperlipidemia)   . HTN (hypertension)   . Primary pulmonary HTN   . Benign hypertensive heart disease without heart failure   . CAD (coronary artery disease)    Past Surgical History  Procedure Date  . Coronary artery bypass graft 1980  . Laparoscopic cholecystectomy   . Ptca     08/01/11 1200  PT Visit Information  Last PT Received On 08/01/11  Patient Stated Goals  Goal #1 Wife wishes pt back to normal  Precautions  Precautions Fall  Restrictions  Weight Bearing Restrictions No  Home Living  Lives With Spouse  Receives Help From Family  Additional Comments pt with dementia waxing and waning.  Per wife pt has good days where he is fairly Independent and bad days where he relies on her for care.    Prior Function  Level of Independence Needs assistance with ADLs;Needs assistance with homemaking;Needs assistance with gait;Needs assistance with tranfers  Cognition  Overall Cognitive Status History of cognitive impairments  History of Cognitive Impairment Decline in baseline functioning  Attention Impaired  Current Attention Level Focused  Orientation Level Oriented to person;Disoriented to place;Disoriented to time;Disoriented to situation  Following Commands Follows one step commands inconsistently  Bed Mobility  Bed Mobility Yes  Rolling Left 3: Mod assist  Rolling Left Details (indicate cue type and reason) max cueing for attention to task and safety, sequencing  Supine to Sit 2: Max assist  Supine to Sit Details (indicate cue type and reason) cues for participation, pt attempts to A, however needs constant re-orienting to task  Sitting - Scoot to Edge of Bed 2: Max assist  Sit to Supine 2: Max assist  Sit to Supine - Details (indicate cue type and reason) cues for participation, however pt with minimally able to A with retrun to bed.    Transfers    Transfers No  Ambulation/Gait  Ambulation/Gait No  Stairs No  Wheelchair Mobility  Wheelchair Mobility No  Posture/Postural Control  Posture/Postural Control Postural limitations  Postural Limitations pt with increased tone and difficulty flexing trunk during sitting  Balance  Balance Assessed Yes  Static Sitting Balance  Static Sitting - Balance Support Bilateral upper extremity supported;Feet supported;Feet unsupported  Static Sitting - Level of Assistance 4: Min assist;3: Mod assist  Static Sitting - Comment/# of Minutes pt unable to flex trunk enough for sitting EOB secondary to increased tone and decreased attention to task  RLE Assessment  RLE Assessment (pt difficult to assess, but increased tone and weak.  )  LLE Assessment  LLE Assessment (Difficult to assess, but grossly increased tone and weak.  )  PT - End of Session  Activity Tolerance Patient limited by fatigue  Patient left in bed;with call bell in reach;with bed alarm set;with family/visitor present  Nurse Communication Mobility status for transfers  General  Behavior During Session Flat affect  Cognition Impaired, at baseline  PT Assessment  Clinical Impression Statement pt presents with AMS.  pt with focused attention only and difficulty maintaining arousal.  RN present in room during PT.    PT Recommendation/Assessment Patient will need skilled PT in the acute care venue  PT Problem List Decreased strength;Decreased range of motion;Decreased activity tolerance;Decreased balance;Decreased mobility;Decreased cognition;Decreased knowledge of use of DME;Decreased safety awareness;Impaired tone;Pain  Barriers to Discharge None  PT Therapy Diagnosis  Generalized weakness;Altered mental status  PT Plan  PT Frequency Min 3X/week  PT Treatment/Interventions DME instruction;Gait training;Functional mobility training;Therapeutic activities;Therapeutic exercise;Balance training;Patient/family education  PT Recommendation   Recommendations for Other Services OT consult  Follow Up Recommendations Skilled nursing facility  Equipment Recommended Defer to next venue  Individuals Consulted  Consulted and Agree with Results and Recommendations Family member/caregiver  Family Member Consulted Wife  Acute Rehab PT Goals  PT Goal Formulation With family  Time For Goal Achievement 2 weeks  Pt will go Supine/Side to Sit with min assist  PT Goal: Supine/Side to Sit - Progress Goal set today  Pt will go Sit to Supine/Side with min assist  PT Goal: Sit to Supine/Side - Progress Goal set today  Pt will go Sit to Stand with min assist  PT Goal: Sit to Stand - Progress Goal set today  Pt will go Stand to Sit with min assist  PT Goal: Stand to Sit - Progress Goal set today  Pt will Ambulate 16 - 50 feet;with min assist;with rolling walker  PT Goal: Ambulate - Progress Goal set today    Mack Hook, PT [phone]

## 2011-08-01 NOTE — ED Provider Notes (Signed)
I saw and evaluated the patient, reviewed the resident's note and I agree with the findings and plan.  Pt seen and evaulated- pt with intermittent episodes of confusion/altered mental status.  Today had generalized weakness causing fall.  No traumatic injuries on exam.  Pt has nonfocal neuro exam.  Workup reassuring, CXR read as possible pneumonia, but no cough, no WBC, no hypoxia.  Pt unable to ambulate after trial in ED so admitted to medical service for further management  Ethelda Chick, MD 08/01/11 [phone]

## 2011-08-01 NOTE — Consult Note (Signed)
CARDIOLOGY CONSULT NOTE    Patient ID: Arthur Lane MRN: [id number] DOB/AGE: [birth year] 78 y.o.  Admit date: 07/31/2011 Referring Physician Doralee Albino MD Primary Physician Margaretmary Bayley MD Primary Cardiologist Olga Millers MD Reason for Consultation PVCs, bradycardia  HPI: Mr. Arthur Lane is a very pleasant 78 year old white male that we are asked to see in consult by the family practice service for bradycardia and PVCs. The patient has a known history of coronary disease and is status post coronary bypass surgery in 1980. This was performed in [place]. He had angioplasty of the left circumflex coronary in 1999. He has done very well from a cardiac standpoint. He is followed regularly by Dr. Jens Som. He has a history of hypertension as well as postural hypotension. He has a history of hyperlipidemia. His last nuclear stress test in May of 2010 showed no ischemia or infarction and ejection fraction of 60%. He had an echocardiogram in June of 2012 which showed mild LVH with an ejection fraction of 60%. He had grade 2 diastolic dysfunction. There was mild posterior mitral valve prolapse with mild mitral insufficiency. There was moderate left atrial enlargement and mild right atrial enlargement. Estimated right ventricular systolic pressure was 47 mm of mercury.  The patient was admitted yesterday for evaluation of increased weakness and fever. He has a history of dementia and is on Aricept. According to the family he generally is well oriented but has had some periods of disorientation. Yesterday the patient developed difficulty standing from a sitting position. His wife reports he  just became stiff and fell to the floor. His mental status hasn't been as sharp. He has had a cough that has been productive of some greenish sputum. He has had no known fever at home but his temperature today was up to 101. He has had no increased shortness of breath, chest pain, or edema. He has had no difficulty  swallowing or choking. His appetite has been good. He did have 2 days of mild diarrhea. While here in the hospital the patient has been noted on telemetry to have frequent PVCs, PACs, and some bradycardia with heart rates in the 50s.  Review of systems was provided primarily by his spouse. Patient is sleeping soundly and is unable to answer questions appropriately.  Past Medical History  Diagnosis Date  . Colonic polyp   . Diverticulosis of colon   . HLD (hyperlipidemia)   . HTN (hypertension)   . Primary pulmonary HTN   . Benign hypertensive heart disease without heart failure   . CAD (coronary artery disease)     No family history on file.  History   Social History  . Marital Status: Married    Spouse Name: N/A    Number of Children: 2  . Years of Education: N/A   Occupational History  . retired    Social History Main Topics  . Smoking status: Former Games developer  . Smokeless tobacco: Not on file  . Alcohol Use: Yes     rarely  . Drug Use: No  . Sexually Active: Not on file   Other Topics Concern  . Not on file   Social History Narrative  . No narrative on file    Past Surgical History  Procedure Date  . Coronary artery bypass graft 1980  . Laparoscopic cholecystectomy   . Ptca      Prescriptions prior to admission  Medication Sig Dispense Refill  . aspirin EC 325 MG tablet Take 325 mg by mouth daily.      [name]  donepezil (ARICEPT) 10 MG tablet Take 10 mg by mouth daily.       [name] ezetimibe (ZETIA) 10 MG tablet Take 10 mg by mouth daily.      . Prenatal MV-Min-Fe Fum-FA-DHA (CENTRUM SPECIALIST PRENATAL PO) Take 1 capsule by mouth daily.      . simvastatin (ZOCOR) 40 MG tablet Take 40 mg by mouth every evening.        Physical Exam: Blood pressure 142/84, pulse 72, temperature 101.4 F (38.6 C), temperature source Rectal, resp. rate 20, height 5\' 9"  (1.753 m), weight 64.1 kg (141 lb 5 oz), SpO2 93.00%.  He is an elderly white male in no acute distress. He is sleeping  soundly. He is normocephalic, atraumatic. His pupils are equal round and reactive to light and accommodation. Sclera are clear. Oropharynx is clear. Neck is supple without JVD, adenopathy, thyromegaly, or bruits. Carotid upstrokes are normal. Lungs reveal decreased breath sounds and coarse rhonchi in the left base. Cardiovascular: Regular rate and rhythm without gallop, murmur, or click. S1 and S2 are normal. There is no rub. Abdomen: Soft, nontender. No masses, hepatosplenomegaly, or bruits. Extremities: No cyanosis, clubbing, or edema. Pedal pulses are 2+. Skin: Warm and dry without significant rashes. Neuro: Patient is sleeping soundly. He does move all extremities spontaneously.  Labs:   Lab Results  Component Value Date   WBC 5.4 08/01/2011   HGB 12.6* 08/01/2011   HCT 37.6* 08/01/2011   MCV 94.7 08/01/2011   PLT 79* 08/01/2011    Lab 08/01/11 0633  NA 140  K 3.4*  CL 104  CO2 27  BUN 22  CREATININE 1.02  CALCIUM 8.8  PROT 6.0  BILITOT 1.4*  ALKPHOS 78  ALT 16  AST 22  GLUCOSE 98   Lab Results  Component Value Date   CKTOTAL 103 08/01/2011   CKMB 2.1 08/01/2011   TROPONINI <0.30 08/01/2011    Lab Results  Component Value Date   CHOL 114 08/08/2010   CHOL 108 09/23/2008   CHOL 109 06/02/2007   Lab Results  Component Value Date   HDL 42.40 08/08/2010   HDL 04.54* 09/23/2008   HDL 36.1* 06/02/2007   Lab Results  Component Value Date   LDLCALC 56 08/08/2010   LDLCALC 54 09/23/2008   LDLCALC 59 06/02/2007   Lab Results  Component Value Date   TRIG 76.0 08/08/2010   TRIG 84.0 09/23/2008   TRIG 72 06/02/2007   Lab Results  Component Value Date   CHOLHDL 3 08/08/2010   CHOLHDL 3 09/23/2008   CHOLHDL 3.0 CALC 06/02/2007   No results found for this basename: LDLDIRECT      Radiology:Cardiomegaly with retrocardiac infiltrate/atelectasis.  EKG: Normal sinus rhythm with frequent PACs and multifocal PVCs. Nonspecific ST abnormality.  ASSESSMENT AND PLAN:  1. Sinus  bradycardia. Rates are typically in the upper 50s and low 60s. This is his baseline rate. He is on no medications that would exacerbate his bradycardia.  2. PVCs and PACs. There is no mention in prior cardiac notes of history of ectopy but in reviewing his old EKGs he has had PVCs and PACs noted on prior EKGs dating back to at least 2010. This was also noted at the time of his stress test in 2010. I suspect his ectopy is old. It is asymptomatic. Given his normal left ventricular function and lack of evidence of ischemia I would not recommend medical therapy specifically for his ectopy. I would recommend repleting his potassium to greater than  4.  3. Coronary disease with remote coronary bypass surgery in 1980. Status post PTCA of the circumflex in 1999. He is asymptomatic. Last stress test in 2010 was normal.  4. Mild mitral valve prolapse with mild insufficiency.  5. Hyperlipidemia.  6. Fever. Suspect pulmonary process with cough and retrocardiac infiltrate on chest x-ray. No other clear source of fever.  7. Dementia.  8. History of hypertension with postural hypotension. Blood pressure measurements are within acceptable range here.  Plan: No further cardiac evaluation or treatment indicated at this point. I would replete his potassium to greater than 4. I would treat his infection as necessary. I suspect with resolution of his infection and fever his strength and mental status will hopefully return to baseline. Please call we can be of further assistance.   SignedTheron Arista [facility] 08/01/2011, 5:16 PM

## 2011-08-02 DIAGNOSIS — I498 Other specified cardiac arrhythmias: Secondary | ICD-10-CM

## 2011-08-02 LAB — BASIC METABOLIC PANEL
BUN: 14 mg/dL (ref 6–23)
Chloride: 104 mEq/L (ref 96–112)
GFR calc Af Amer: 86 mL/min — ABNORMAL LOW (ref >90)
Glucose, Bld: 122 mg/dL — ABNORMAL HIGH (ref 70–99)
Potassium: 3.7 mEq/L (ref 3.5–5.1)

## 2011-08-02 LAB — CBC
HCT: 38.5 % — ABNORMAL LOW (ref 39.0–52.0)
Hemoglobin: 12.8 g/dL — ABNORMAL LOW (ref 13.0–17.0)
MCHC: 33.2 g/dL (ref 30.0–36.0)

## 2011-08-02 MED ORDER — POTASSIUM CHLORIDE CRYS ER 20 MEQ PO TBCR
40.0000 meq | EXTENDED_RELEASE_TABLET | Freq: Once | ORAL | Status: AC
  Administered 2011-08-02: 40 meq via ORAL
  Filled 2011-08-02: qty 2

## 2011-08-02 NOTE — Progress Notes (Signed)
OT Cancellation Note  Treatment cancelled due to pt sleeping after a restless night, per wife.  Wife asking OT to return at later time.  Will continue to follow.  Evern Bio 08/02/2011, 9:21 AM

## 2011-08-02 NOTE — Progress Notes (Signed)
I have seen and examined this patient. I have discussed with Dr Aviva Signs.  I agree with their findings and plans as documented in their Progress note for today.  Acute Issues  1.  Pneumonia, community-acquired  - Retrocardiac opacity on CHEST XRAY - Wife reports recent acute cough with purulent sputum. - Fever(+) - Plan: Agree with rocephin and Azithromycin.  Change to oral antibiotics once afebrile for 24 hours.  2.  Unsteady Gait. -  Pt with right arm cogwheeling muscle tone.  Increase tone in bilateral legs. (-) glabella sign. -  Falling backwards with standing.  -  Head CT (07/31/11) with cerebral and cerebellar atrophy, No acute intracranial abnormalities. -  Differential Diagnosis: Likely Multifactorial: Postural lightheadedness, bradycardia, decreased proprioception, Pneumonia, but concern for parkinson-like signs. -  Plan: Physical therapy and occupational therapy.  Consult Neurology Service for further evaluation of gait abnormality.  -  Check O-statics BP.

## 2011-08-02 NOTE — Progress Notes (Signed)
Daily Progress Note Family Medicine Teaching Service PGY-1   Patient name: Arthur Lane  Medical record [id number] Date of birth:1923/07/07 Age: 78 y.o. Gender: male  LOS: 2 days   Subjective: Continue to improve. Good appetite. Reported mild agitation overnight but overall better mentation that in the past.  Objective: Vitals: Patient Vitals for the past 24 hrs:  BP Temp Temp src Pulse Resp SpO2  08/02/11 0400 134/74 mmHg 99.3 F (37.4 C) Oral 85  18  95 %    Intake/Output Summary (Last 24 hours) at 08/02/11 0748 Last data filed at 08/01/11 2100  Gross per 24 hour  Intake   2605 ml  Output   1000 ml  Net   1605 ml   Physical Exam: Gen: no in acute distress but mildly combative.  HEENT: Moist mucous membranes. No oral lesions or exudates. Ears with normal TM bilaterally. PERRL and EOMI. No adenopathies.  CV: Regular rate and rhythm, no murmurs rubs or gallops  PULM: Clear to auscultation bilaterally. No wheezes/rales/rhonchi  ABD: Soft, non tender, non distended, normal bowel sounds  EXT: No edema. Has 3 excoriations on left upper extremity in the process of healing. No erythema, drainage or  Neuro: Alert and oriented to person. Moderately cooperative with exam but semmed confussed with short commands. 5/5 muscular strength. Difficult to explore sensitivity and coordination. Pt has normal reflexes and good range of motion but can't stand up. Thighs become spastic  Labs and imaging:  CBC  Lab 08/02/11 0905 08/01/11 0633 07/31/11 1946  WBC 7.1 5.4 6.5  HGB 12.8* 12.6* 13.2  HCT 38.5* 37.6* 40.0  PLT 79* 79* 91*   BMET  Lab 08/02/11 0905 08/01/11 0633 07/31/11 1946  NA 137 140 137  K 3.7 3.4* 3.9  CL 104 104 104  CO2 26 27 26   BUN 14 22 25*  CREATININE 0.95 1.02 1.11  LABGLOM -- -- --  GLUCOSE 122* -- --  CALCIUM 8.6 8.8 9.0   Results for orders placed during the hospital encounter of 07/31/11 (from the past 24 hour(s))  CARDIAC PANEL(CRET  KIN+CKTOT+MB+TROPI)     Status: Normal   Collection Time   08/01/11 11:28 AM      Component Value Range   Total CK 103  7 - 232 (U/L)   CK, MB 2.1  0.3 - 4.0 (ng/mL)   Troponin I <0.30  <0.30 (ng/mL)   Relative Index 2.0  0.0 - 2.5    Dg Chest 2 View  08/01/2011  Comparison: 07/31/2011  Findings: The retrocardiac atelectasis or infiltrates seen on yesterday's study persists, with obscuration of the left hemidiaphragmatic dome. The cardiopericardial silhouette is enlarged. There is pulmonary vascular congestion without overt pulmonary edema. Interstitial markings are diffusely coarsened with chronic features. Telemetry leads overlie the chest.  IMPRESSION: Persistent retrocardiac atelectasis/infiltrate.  Cardiomegaly with vascular congestion.  Original Report Authenticated By: ERIC A. MANSELL, M.D.   Medications: Scheduled Meds:   . aspirin EC  325 mg Oral Q2000  . azithromycin  500 mg Intravenous Q24H  . cefTRIAXone (ROCEPHIN)  IV  1 g Intravenous Q24H  . donepezil  10 mg Oral QAC supper  . ezetimibe  10 mg Oral Q2000  . mulitivitamin with minerals  1 tablet Oral Daily  . potassium chloride  40 mEq Oral Q4H  . simvastatin  40 mg Oral Q2000   Continuous Infusions:   . sodium chloride 100 mL/hr at 08/01/11 0152  . dextrose 5 % and 0.45% NaCl 100 mL/hr at 08/01/11  1341   PRN Meds:.acetaminophen, acetaminophen Assessment and Plan: Walker Sitar is a 78 y.o. male presenting with inability to walk, increase decline in his baseline dementia.  1. AMS: Pt with Hx of dementia on Aricept. Not clear what is causing this inability to walk and sudden decline of his baseline mental status. No delirium- like symptoms, no syncope like presentation, no neurologic focalization. CT head negative. CXR with  Persistent retrocadic infiltration/atelectasies after hydration. This could potentially explained the decline of his mental status.  - continue Rocephin and Azithro and f/u improvement. BCx pending  results.  3. Diarrhea: No more BM since admission. Pt w/ hx of diverticulosis. Diarrheas were non-bloody. More likely viral GI etiology. Normal electrolytes.  - IV fluid at maintenance. pt has good PO intake, will decrease IV fluids .   4. Hx of HTN; no home meds. On ED BP were stable,although pt gets upset when the cuff inflates and tries to take it off his arm.  -will continue to monitor. If BP readings are high, recheck BP manually.  4. Sinus Bradycardia and PVC's: Cardiology consulted. Not further cardiac evaluation or  pharmacologic intervention needed at this time. Recommended keep K greater than 4.  5. Hyperlipidemia: Last Lipid profile recently and WNL  -Continue Zetia and Simvastatin.   6. Thrombocytopenia: stable.   FEN/GI : HH diet. D5 1/2 NS at 75 ml/h Prophylaxis: Heparin  Dispo: pending pt improvement    D. Piloto Rolene Arbour, MD PGY1, [facility] Medicine Teaching Service Pager [phone] 08/02/2011

## 2011-08-03 ENCOUNTER — Encounter (HOSPITAL_COMMUNITY): Payer: Self-pay | Admitting: General Practice

## 2011-08-03 LAB — BASIC METABOLIC PANEL
BUN: 14 mg/dL (ref 6–23)
Calcium: 9 mg/dL (ref 8.4–10.5)
Creatinine, Ser: 1.07 mg/dL (ref 0.50–1.35)
GFR calc Af Amer: 71 mL/min — ABNORMAL LOW (ref >90)
GFR calc non Af Amer: 61 mL/min — ABNORMAL LOW (ref >90)
Glucose, Bld: 108 mg/dL — ABNORMAL HIGH (ref 70–99)

## 2011-08-03 MED ORDER — AZITHROMYCIN 500 MG PO TABS
500.0000 mg | ORAL_TABLET | Freq: Every day | ORAL | Status: AC
  Administered 2011-08-03 – 2011-08-06 (×4): 500 mg via ORAL
  Filled 2011-08-03 (×4): qty 1

## 2011-08-03 NOTE — Evaluation (Signed)
Occupational Therapy Evaluation Patient Details Name: Arthur Lane MRN: [id number] DOB: [birth year] Today's Date: 08/03/2011  Problem List:  Patient Active Problem List  Diagnoses  . HYPERLIPIDEMIA  . HYPERTENSION  . BEN HTN HEART DISEASE WITHOUT HEART FAIL  . PRIMARY PULMONARY HYPERTENSION  . BRADYCARDIA  . POSTURAL LIGHTHEADEDNESS  . COLONIC POLYPS, HX OF  . DIVERTICULOSIS, COLON, HX OF  . CHOLECYSTECTOMY, LAPAROSCOPIC, HX OF  . CORONARY ARTERY BYPASS GRAFT, HX OF  . PERCUTANEOUS TRANSLUMINAL CORONARY ANGIOPLASTY, HX OF  . Mitral regurgitation  . CAD (coronary artery disease)  . PVCs (premature ventricular contractions)  . PAC (premature atrial contraction)    Past Medical History:  Past Medical History  Diagnosis Date  . Colonic polyp   . Diverticulosis of colon   . HLD (hyperlipidemia)   . HTN (hypertension)   . Primary pulmonary HTN   . Benign hypertensive heart disease without heart failure   . CAD (coronary artery disease)   . Pneumonia   . Cancer     hx of skin ca  . Arthritis   . DEMENTIA    Past Surgical History:  Past Surgical History  Procedure Date  . Coronary artery bypass graft 1980  . Laparoscopic cholecystectomy   . Ptca     OT Assessment/Plan/Recommendation OT Assessment Clinical Impression Statement: Pt admitted with AMS and presents with below problem list which is complicated by baseline dementia. Will benefit from skilled OT in the acute setting to maximize I with ADL and ADL mobility in order to decrease caregiver burden and help facilitate safe d/c to next venue of care. At this time, pt is unsafe to d/c home with wife, as adequate assist is not available. Therefore, he will require re[facility] prior to return home. Wife is interested in inpatient rehab. OT Recommendation/Assessment: Patient will need skilled OT in the acute care venue OT Problem List: Decreased strength;Decreased activity tolerance;Impaired vision/perception;Impaired  balance (sitting and/or standing);Decreased cognition;Decreased knowledge of use of DME or AE OT Therapy Diagnosis : Generalized weakness;Cognitive deficits;Altered mental status OT Plan OT Frequency: Min 1X/week OT Treatment/Interventions: Self-care/ADL training;Therapeutic activities;Cognitive remediation/compensation;Patient/family education;Balance training;Visual/perceptual remediation/compensation;DME and/or AE instruction OT Recommendation Follow Up Recommendations: Skilled nursing facility Equipment Recommended: Defer to next venue Individuals Consulted Consulted and Agree with Results and Recommendations: Family member/caregiver;Patient Family Member Consulted: wife OT Goals Acute Rehab OT Goals OT Goal Formulation: With patient Time For Goal Achievement: 2 weeks ADL Goals Pt Will Perform Grooming: with supervision;with set-up;Sitting, chair;Sitting, edge of bed;Unsupported;Supported ADL Goal: Grooming - Progress: Goal set today Pt Will Perform Upper Body Bathing: with supervision;with set-up;Sitting, chair;Sitting, edge of bed ADL Goal: Upper Body Bathing - Progress: Goal set today Pt Will Perform Upper Body Dressing: with set-up;with supervision;with cueing (comment type and amount);Sitting, bed;Sitting, chair (Min VC) ADL Goal: Upper Body Dressing - Progress: Goal set today Pt Will Transfer to Toilet: with max assist;Stand pivot transfer;Ambulation;with DME;3-in-1 ADL Goal: Toilet Transfer - Progress: Goal set today  OT Evaluation Precautions/Restrictions  Precautions Precautions: Fall Restrictions Weight Bearing Restrictions: No Prior Functioning Home Living Lives With: Spouse Receives Help From: Family Type of Home: House Bathroom Toilet: Standard Additional Comments: pt with dementia waxing and waning.  Per wife pt has good days where he is fairly Independent and bad days where he relies on her for care.   Prior Function Level of Independence: Needs assistance  with ADLs;Needs assistance with homemaking;Needs assistance with gait;Needs assistance with tranfers Driving: Yes (on "good" days) ADL ADL Eating/Feeding: Not assessed Grooming:  Performed;Wash/dry face;Minimal assistance Grooming Details (indicate cue type and reason): Min A to maintain sitting balance while seated EOB as pt with tendency for posterior lean Where Assessed - Grooming: Sitting, bed Upper Body Bathing: Simulated;Minimal assistance Upper Body Bathing Details (indicate cue type and reason): VC to remain on task as pt with decreased attention span Where Assessed - Upper Body Bathing: Sitting, bed Lower Body Bathing: Simulated;Maximal assistance Where Assessed - Lower Body Bathing: Sitting, bed;Lean right and/or left Upper Body Dressing: Simulated;Moderate assistance Where Assessed - Upper Body Dressing: Sitting, bed Lower Body Dressing: Simulated;+1 Total assistance Where Assessed - Lower Body Dressing: Rolling right and/or left Toilet Transfer: Not assessed Toileting - Clothing Manipulation: Not assessed Toileting - Hygiene: Not assessed Tub/Shower Transfer: Not assessed Ambulation Related to ADLs: NT secondary pt unable to complete full sit to stand from EOB ADL Comments: Pt with heavy posterior lean, hip extension, knee extension and plantar flexion which impedes sitting balance at EOB as well as sit to stand transfer. Performed sitting balance activities at EOB, encouraging forward flexion (to feet) and reaching activities with return to center and upright sitting. Pt able to sit EOB with supervision as long as he kept bilateral hand on knees Vision/Perception  Vision - Assessment Additional Comments: Pt would reach out and miss when grabbing for objects. Did not formally assess vision- pt may benefit from this although he denies any visual deficits Cognition Cognition Overall Cognitive Status: History of cognitive impairments Attention: Impaired Current Attention Level:  Sustained (~47min) Following Commands: Follows one step commands inconsistently Sensation/Coordination Sensation Light Touch: Appears Intact Extremity Assessment RUE Assessment RUE Assessment: Not tested LUE Assessment LUE Assessment: Not tested Mobility  Bed Mobility Rolling Left: With rail;3: Mod assist Left Sidelying to Sit: 3: Mod assist;With rails;HOB elevated (comment degrees) (~40degrees) Left Sidelying to Sit Details (indicate cue type and reason): assist to maintain bilateral feet off EOB and Assist to bring trunk over BOS Transfers Sit to Stand: 2: Max assist;From bed Sit to Stand Details (indicate cue type and reason): Attempted sit to stand x 4 at EOB. Pt with plantar flexion, knee extension and hip extension preventing full upright standing. VC for pt to lean forward. Pt did much better with sit to stand when given cues to lean forward and told, "OK. Stand up, Ed." Would benefit from +2 next session. Stand to Sit: 2: Max assist;To bed End of Session OT - End of Session Equipment Utilized During Treatment: Gait belt Activity Tolerance: Patient tolerated treatment well Patient left: in bed;with call bell in reach;with bed alarm set;with family/visitor present General Behavior During Session: [facility] for tasks performed Cognition: Impaired, at baseline   Ermal Brzozowski 08/03/2011, 3:34 PM

## 2011-08-03 NOTE — Progress Notes (Signed)
Pt concerned about pt's left eye discharge. Pt family claims it was matted a lot this am . Family is requesting to have his left eye checked for possible infection. Thanks! Ancil Linsey RN

## 2011-08-03 NOTE — Progress Notes (Signed)
I have seen and examined this patient. I have discussed with Dr Aviva Signs.  I agree with their findings and plans as documented in their progress note for today.  Improving from Pneumonia -- Switching to ORAL antibiotic to complete course. Abnormal Gait -- Awaiting PT evaluation and recommendations for disposition.

## 2011-08-03 NOTE — Progress Notes (Signed)
Daily Progress Note Family Medicine Teaching Service PGY-1   Patient name: Arthur Lane  Medical record [id number] Date of birth:1923/08/13 Age: 78 y.o. Gender: male  LOS: 3 days   Subjective: Continue to improve. Good appetite. Slept all night. No agitation reported. Afebrile   Objective: Vitals: Patient Vitals for the past 24 hrs: Filed Vitals:   08/03/11 0500  BP: 152/86  Pulse: 77  Temp: 99.3 F (37.4 C)  Resp: 18     Intake/Output Summary (Last 24 hours) at 08/03/11 1014 Last data filed at 08/03/11 0500  Gross per 24 hour  Intake      0 ml  Output    900 ml  Net   -900 ml   Physical Exam: Gen: no in acute distress, pleasant and cooperative. HEENT: Moist mucous membranes.  CV: Regular rate and rhythm, no murmurs rubs or gallops  PULM: Clear to auscultation bilaterally. No wheezes/rales/rhonchi  ABD: Soft, non tender, non distended, normal bowel sounds  EXT: No edema. Has 3 excoriations on left upper extremity in the process of healing. No erythema, drainage or  Neuro: Alert and oriented X3. Cooperative with exam . CN II to XII intact.  No motor or sensorial focalization. Pt has normal reflexes and good range of motion. Normal finger to nose coordination. Gait not explored  Labs and imaging:  CBC  Lab 08/02/11 0905 08/01/11 0633 07/31/11 1946  WBC 7.1 5.4 6.5  HGB 12.8* 12.6* 13.2  HCT 38.5* 37.6* 40.0  PLT 79* 79* 91*   BMET  Lab 08/02/11 0905 08/01/11 0633 07/31/11 1946  NA 137 140 137  K 3.7 3.4* 3.9  CL 104 104 104  CO2 26 27 26   BUN 14 22 25*  CREATININE 0.95 1.02 1.11  LABGLOM -- -- --  GLUCOSE 122* -- --  CALCIUM 8.6 8.8 9.0    Medications: Scheduled Meds:    . aspirin EC  325 mg Oral Q2000  . azithromycin  500 mg Oral Daily  . donepezil  10 mg Oral QAC supper  . ezetimibe  10 mg Oral Q2000  . mulitivitamin with minerals  1 tablet Oral Daily  . simvastatin  40 mg Oral Q2000  . DISCONTD: azithromycin  500 mg Intravenous Q24H    . DISCONTD: cefTRIAXone (ROCEPHIN)  IV  1 g Intravenous Q24H   Continuous Infusions:    . DISCONTD: sodium chloride 100 mL/hr at 08/01/11 0152  . DISCONTD: dextrose 5 % and 0.45% NaCl 75 mL/hr at 08/03/11 0022   PRN Meds:.acetaminophen, acetaminophen  Assessment and Plan: Arthur Lane is a 78 y.o. male presenting with inability to walk, increase decline in his baseline dementia.   1. AMS: Pt with Hx of dementia on Aricept. no neurologic focalization. CT head negative. CXR with  Persistent retrocadic infiltration/atelectasies after hydration. This could potentially explained the decline of his mental status.   2. PNA: Afebrile. No SOB, good Oxygenation.  - BCx negative. - Rocephin stopped and continue azithromycin now PO  3. Dementia: continue aricept. Evaluated last night per Neuroloy. No further intervention needed. Recommended f/u 2-3 weeks after discharge.  4. Hx of HTN; no home meds. BP with sometimes high systolic.  - continue monitoring and if isolated systolic hypertension more than 160 will treat as inpatient other than that we will recommend outpatient f/u.  4. Sinus Bradycardia and PVC's: Cardiology consulted. Not further cardiac evaluation or  pharmacologic intervention needed at this time. Recommended keep K greater than 4. Bmet pending today.  5. Hyperlipidemia:  Last Lipid profile recently and WNL  -Continue Zetia and Simvastatin.   6. Thrombocytopenia: stable.   FEN/GI : HH diet. Saline Lock Prophylaxis: Heparin  Dispo: pending PT/OT evaluation to discharge home   D. [place] Rolene Arbour, MD PGY1, [facility] Medicine Teaching Service Pager [phone] 08/03/2011

## 2011-08-03 NOTE — Consult Note (Signed)
[facility] CONSULT [facility]     Reason for Consult: Altered mental status and gait instability   HPI:    Arthur Lane is an 78 y.o. male with a history of hypertension, hyperlipidemia and dementia who was admitted on 08/01/2011 for altered mental status with difficulty understanding what was being said to him and following simple commands, as well as difficulty with standing and walking. Patient typically uses no assistive device for ambulation. His wife noted increasing confusion and difficulty with standing and walking progressively over several days prior to admission. No focal abnormalities were noted. He was noted to have low-grade fever in the emergency room with a temperature of 100.5. Chest x-ray showed pulmonary infiltrate versus atelectasis. He is currently on IV ceftriaxone for presumed pneumonia. Mental status has improved slightly since admission. Unclear if this ambulation has improved since he has not been out of bed. CT scan of his head on admission showed no acute intracranial abnormality. Mild cerebral and cerebellar atrophy were noted. No indications of possible chronic sinus disease involving right and left ethmoid sinuses as well as right maxillary sinus.  Past Medical History  Diagnosis Date  . Colonic polyp   . Diverticulosis of colon   . HLD (hyperlipidemia)   . HTN (hypertension)   . Primary pulmonary HTN   . Benign hypertensive heart disease without heart failure   . CAD (coronary artery disease)     Past Surgical History  Procedure Date  . Coronary artery bypass graft 1980  . Laparoscopic cholecystectomy   . Ptca     No family history on file.  Social History:  reports that he has quit smoking. He does not have any smokeless tobacco history on file. He reports that he drinks alcohol. He reports that he does not use illicit drugs.  No Known Allergies  Medications:    Scheduled:   . aspirin EC  325 mg Oral Q2000  .  azithromycin  500 mg Intravenous Q24H  . cefTRIAXone (ROCEPHIN)  IV  1 g Intravenous Q24H  . donepezil  10 mg Oral QAC supper  . ezetimibe  10 mg Oral Q2000  . mulitivitamin with minerals  1 tablet Oral Daily  . potassium chloride  40 mEq Oral Once  . simvastatin  40 mg Oral Q2000   Blood pressure 143/90, pulse 84, temperature 99.2 F (37.3 C), temperature source Oral, resp. rate 18, height 5\' 9"  (1.753 m), weight 64.1 kg (141 lb 5 oz), SpO2 93.00%.  Neurologic Examination:  Mental Status: Alert, oriented to person and correct month, and slightly disoriented to place.  Speech fluent without evidence of aphasia. Able to follow simple commands. Cranial Nerves: II-Visual fields were normal. III/IV/VI-Extraocular movements were full and conjugate.  Pupils reacted normally to light. VII-no facial weakness VIII-slightly hard of hearing X-normal speech XII-midline tongue extension Motor: 5/5 bilaterally with normal tone and bulk Sensory:  light touch intact throughout, bilaterally Deep Tendon Reflexes: 1+ and symmetric throughout Plantars: Downgoing bilaterally Cerebellar: Normal finger-to-nose  Assessment/Plan:  Altered mental status with gait instability most likely secondary to underlying dementia, exacerbated by mild febrile illness. No clinical signs of acute stroke. Patient's gait apraxia is likely to improve as his clinical condition improves.  Recommendations: 1. No further neurodiagnostic studies are indicated at this point. 2. Physical therapy evaluation and recommendations regarding patient's gait and ambulation safety. 3. Continue Aricept at  current dose. 4. Followup with [facility] in 2-3 weeks after discharge from the hospital.  Venetia Maxon M.D. [facility] [phone]  08/03/2011, 12:45 AM

## 2011-08-04 MED ORDER — GUAIFENESIN 200 MG PO TABS
400.0000 mg | ORAL_TABLET | Freq: Four times a day (QID) | ORAL | Status: DC | PRN
  Filled 2011-08-04: qty 2

## 2011-08-04 NOTE — Progress Notes (Signed)
Subjective: Patient per report of wife seems back to baseline cognitively.  He is up in bed eating breakfast this morning and able to answer questions appropriately.  Has been working with OT and is requiring max assist to go from sit to stand which seems worse than baseline functioning since wife reports that he was ambulating without a device.    Objective: Current vital signs: BP 158/78  Pulse 53  Temp(Src) 97.6 F (36.4 C) (Oral)  Resp 20  Ht 5\' 9"  (1.753 m)  Wt 64.1 kg (141 lb 5 oz)  BMI 20.87 kg/m2  SpO2 93% Vital signs in last 24 hours: Temp:  [97.6 F (36.4 C)-98.7 F (37.1 C)] 97.6 F (36.4 C) (03/23 0500) Pulse Rate:  [53-78] 53  (03/23 0500) Resp:  [18-20] 20  (03/23 0500) BP: (142-158)/(78-80) 158/78 mmHg (03/23 0500) SpO2:  [93 %-95 %] 93 % (03/23 0500)  Intake/Output from previous day: 03/22 0701 - 03/23 0700 In: 230 [P.O.:230] Out: 1150 [Urine:1150] Intake/Output this shift:   Nutritional status: Cardiac  Neurologic Exam: Mental Status:  Alert, oriented to person, month and place. Speech fluent without evidence of aphasia. Able to follow simple commands but has difficulty with 3-step commands.  Cranial Nerves:  II-Visual fields were normal.  III/IV/VI-Extraocular movements were full and conjugate. Pupils reacted normally to light.  VII-no facial weakness  VIII-slightly hard of hearing  X-normal speech  XII-midline tongue extension  Motor: 5/5 bilaterally with normal tone and bulk  Sensory: light touch intact throughout, bilaterally  Deep Tendon Reflexes: 1+ and symmetric throughout  Plantars: Downgoing bilaterally  Cerebellar: Normal finger-to-nose and heel-to-shin   Lab Results: Results for orders placed during the hospital encounter of 07/31/11 (from the past 48 hour(s))  BASIC METABOLIC PANEL     Status: Abnormal   Collection Time   08/03/11 11:10 AM      Component Value Range Comment   Sodium 137  135 - 145 (mEq/L)    Potassium 4.0  3.5 - 5.1  (mEq/L)    Chloride 104  96 - 112 (mEq/L)    CO2 24  19 - 32 (mEq/L)    Glucose, Bld 108 (*) 70 - 99 (mg/dL)    BUN 14  6 - 23 (mg/dL)    Creatinine, Ser 1.47  0.50 - 1.35 (mg/dL)    Calcium 9.0  8.4 - 10.5 (mg/dL)    GFR calc non Af Amer 61 (*) >90 (mL/min)    GFR calc Af Amer 71 (*) >90 (mL/min)     Recent Results (from the past 240 hour(s))  CULTURE, BLOOD (ROUTINE X 2)     Status: Normal (Preliminary result)   Collection Time   08/01/11  1:32 PM      Component Value Range Status Comment   Specimen Description BLOOD RIGHT ARM   Final    Special Requests BOTTLES DRAWN AEROBIC ONLY [facility]   Final    Culture  Setup Time [id number]   Final    Culture     Final    Value:        BLOOD CULTURE RECEIVED NO GROWTH TO DATE CULTURE WILL BE HELD FOR 5 DAYS BEFORE ISSUING A FINAL NEGATIVE REPORT   Report Status PENDING   Incomplete   CULTURE, BLOOD (ROUTINE X 2)     Status: Normal (Preliminary result)   Collection Time   08/01/11  1:38 PM      Component Value Range Status Comment   Specimen Description BLOOD RIGHT HAND  Final    Special Requests BOTTLES DRAWN AEROBIC AND ANAEROBIC 10CC   Final    Culture  Setup Time [id number]   Final    Culture     Final    Value:        BLOOD CULTURE RECEIVED NO GROWTH TO DATE CULTURE WILL BE HELD FOR 5 DAYS BEFORE ISSUING A FINAL NEGATIVE REPORT   Report Status PENDING   Incomplete     Lipid Panel No results found for this basename: CHOL,TRIG,HDL,CHOLHDL,VLDL,LDLCALC in the last 72 hours  Studies/Results: No results found.  Medications:  I have reviewed the patient's current medications. Scheduled:   . aspirin EC  325 mg Oral Q2000  . azithromycin  500 mg Oral Daily  . donepezil  10 mg Oral QAC supper  . ezetimibe  10 mg Oral Q2000  . mulitivitamin with minerals  1 tablet Oral Daily  . simvastatin  40 mg Oral Q2000  . DISCONTD: azithromycin  500 mg Intravenous Q24H  . DISCONTD: cefTRIAXone (ROCEPHIN)  IV  1 g Intravenous Q24H     Assessment/Plan:  Patient Active Hospital Problem List: Metabolic encephalopathy   Assessment:  Patient seems to be improving.     Plan:  Continue to work with therapy.  No further intervention recommended at this time.    LOS: 4 days   Thana Farr, MD [facility] [phone] 08/04/2011  9:22 AM

## 2011-08-04 NOTE — Progress Notes (Signed)
Pt OOB today to chair x 5 hrs with 2 assist. Pt tolerated well. Ancil Linsey RN

## 2011-08-04 NOTE — Progress Notes (Signed)
Subjective: Doing well today. Feels improved and stronger today. Wonders why he cannot go home. Complains of a continued cough. He notes that it feels productive yet he can't cough anything up. Would like to try some Mucinex.   Objective: Vital signs in last 24 hours: Temp:  [97.6 F (36.4 C)-98.7 F (37.1 C)] 97.6 F (36.4 C) (03/23 0500) Pulse Rate:  [53-78] 53  (03/23 0500) Resp:  [18-20] 20  (03/23 0500) BP: (142-158)/(78-80) 158/78 mmHg (03/23 0500) SpO2:  [93 %-95 %] 93 % (03/23 0500) Weight change:  Last BM Date: 08/03/11  Intake/Output from previous day: 03/22 0701 - 03/23 0700 In: 230 [P.O.:230] Out: 1150 [Urine:1150] Intake/Output this shift:    Exam:  BP 158/78  Pulse 53  Temp(Src) 97.6 F (36.4 C) (Oral)  Resp 20  Ht 5\' 9"  (1.753 m)  Wt 141 lb 5 oz (64.1 kg)  BMI 20.87 kg/m2  SpO2 93% Gen: Well NAD, sitting in hospital bed. Alert and oriented.  HEENT: EOMI,  MMM Lungs: Nl WOB ronchi bl. No crackles or wheeze.  Heart: RRR no MRG Abd: NABS, NT, ND Exts: Non edematous BL  LE, warm and well perfused.    Lab Results:  [facility] 08/02/11 0905  WBC 7.1  HGB 12.8*  HCT 38.5*  PLT 79*   BMET  Basename 08/03/11 1110 08/02/11 0905  NA 137 137  K 4.0 3.7  CL 104 104  CO2 24 26  GLUCOSE 108* 122*  BUN 14 14  CREATININE 1.07 0.95  CALCIUM 9.0 8.6      Medications:  Scheduled:   . aspirin EC  325 mg Oral Q2000  . azithromycin  500 mg Oral Daily  . donepezil  10 mg Oral QAC supper  . ezetimibe  10 mg Oral Q2000  . mulitivitamin with minerals  1 tablet Oral Daily  . simvastatin  40 mg Oral Q2000  . DISCONTD: azithromycin  500 mg Intravenous Q24H  . DISCONTD: cefTRIAXone (ROCEPHIN)  IV  1 g Intravenous Q24H    Assessment/Plan: Arthur Lane is a 78 y.o. male presenting with inability to walk, increase decline in his baseline dementia.  1. AMS: Pt with Hx of dementia on Aricept. no neurologic focalization. CT head negative. Seems improved  today. His conversation this morning made sense and his though process was linear and goal directed. I suspect that the patient had delirium which is improving. Plan for serial evaluation.   2. PNA: Afebrile. No SOB, good Oxygenation.  - BCx negative.  - Rocephin stopped and continue azithromycin now PO x a total of 5 doses. We'll continue to follow  3. Dementia: continue aricept. Evaluated last night per Neuroloy. No further intervention needed. Recommended f/u 2-3 weeks after discharge.   4. Hx of HTN; no home meds. BP with sometimes high systolic.  - continue monitoring and if isolated systolic hypertension more than 160 will treat as inpatient other than that we will recommend outpatient f/u.   4. Sinus Bradycardia and PVC's: Cardiology consulted. Not further cardiac evaluation or pharmacologic intervention needed at this time.  Recommended keep K greater than 4. Bmet yesterday was 4.  If still not Hospital on Sunday we'll obtain repeat basic metabolic.  5. Hyperlipidemia: Last Lipid profile recently and WNL  -Continue Zetia and Simvastatin.   6. Thrombocytopenia: stable.   7. Cough: Likely post pneumonia/post viral.  Will prescribe Mucinex and continue to follow.  FEN/GI : HH diet. Saline Lock  Prophylaxis: Heparin  Dispo: pending PT/OT evaluation  to discharge home will repeat PT evaluation today.     LOS: 4 days   Stephene Alegria 08/04/2011, 9:35 AM [phone]

## 2011-08-04 NOTE — Progress Notes (Signed)
I have seen and examined this patient. I have discussed with Dr Denyse Amass.  I agree with their findings and plans as documented in their progress note for today.  Acute Issues  1. Pneumonia, CAP - Improving - Complete course of oral antibiotics. 2. Unstable stance and Gait. - Patient falling backwards with trying to stand with me during exam today. Needed moderate assitance with transfer from lying to sitting.  - Given stance instability and patient's motivation and ability to follow instructions, I would like to consult CIR to see if he is a candidate for more intensive rehabilitation.

## 2011-08-05 LAB — BASIC METABOLIC PANEL
BUN: 24 mg/dL — ABNORMAL HIGH (ref 6–23)
CO2: 25 mEq/L (ref 19–32)
Calcium: 8.8 mg/dL (ref 8.4–10.5)
Calcium: 9 mg/dL (ref 8.4–10.5)
Creatinine, Ser: 1.11 mg/dL (ref 0.50–1.35)
Creatinine, Ser: 1.2 mg/dL (ref 0.50–1.35)
GFR calc Af Amer: 62 mL/min — ABNORMAL LOW (ref >90)
GFR calc Af Amer: 68 mL/min — ABNORMAL LOW (ref >90)
GFR calc non Af Amer: 53 mL/min — ABNORMAL LOW (ref >90)
GFR calc non Af Amer: 59 mL/min — ABNORMAL LOW (ref >90)
Sodium: 137 mEq/L (ref 135–145)

## 2011-08-05 MED ORDER — POTASSIUM CHLORIDE CRYS ER 20 MEQ PO TBCR
20.0000 meq | EXTENDED_RELEASE_TABLET | Freq: Once | ORAL | Status: AC
  Administered 2011-08-05: 20 meq via ORAL
  Filled 2011-08-05: qty 1

## 2011-08-05 NOTE — Progress Notes (Signed)
Spoke with [name] by phone to offer support by discussing options for rehab. At this time, [name] prefers that her husband goes to inpatient rehab. Noted a rehab consult has been ordered. The spouse agreed to a snf search as a back up plan. We will reassess on next week, hope to have a disposition soon.  Arthur Lane, MSW, LCSW Clinical Social Work Chiropodist

## 2011-08-05 NOTE — Progress Notes (Signed)
Pt noted to have 2.3 second pause on telemetry monitor w/ HR decreased to 38 during pause.  Pt. Asleep, asymptomatic.  Denies SOB, CP, palpitations.  BP 120/60.  MD notified.  Will continue to monitor patient.

## 2011-08-05 NOTE — Progress Notes (Signed)
I have seen and examined this patient. I have discussed with Dr Aviva Signs.  I agree with their findings and plans as documented in their progress note for today.  Awaiting CIR evaluation. Since Sinus pause in early morning hours and less than 3.0 sec, no intervention for now. Check BMET. Aricept (Acetyl Cholinesterase Inhibitor associated with bradycardia).  Evidence from Case-control study of Acetyl cholinesterase inhibitors and clarithromycin found no evidence of increased risk of cardiac events with this medication combination. Will stop Azithromycin after today's dose.

## 2011-08-05 NOTE — Progress Notes (Signed)
Daily Progress Note Family Medicine Teaching Service PGY-1   Patient name: Arthur Lane  Medical record [id number] Date of birth:Nov 16, 1923 Age: 78 y.o. Gender: male  LOS: 5 days   Subjective: Continue to improve. Good appetite. Slept all night. No agitation reported. Afebrile   Objective: Vitals: Patient Vitals for the past 24 hrs: Filed Vitals:   08/05/11 0500  BP: 151/87  Pulse: 75  Temp: 97.9 F (36.6 C)  Resp: 18    Physical Exam: Gen: no in acute distress, pleasant and cooperative. HEENT: Moist mucous membranes.  CV: Regular rate and rhythm, no murmurs rubs or gallops  PULM: Clear to auscultation bilaterally. No wheezes/rales/rhonchi  ABD: Soft, non tender, non distended, normal bowel sounds  EXT: No edema. Has 3 excoriations on left upper extremity in the process of healing. No erythema or drainage Neuro: Alert and oriented X3. Cooperative with exam . CN II to XII intact.  No motor or sensorial focalization. Pt has normal reflexes and good range of motion. Normal finger to nose coordination.  Labs and imaging:  CBC  Lab 08/02/11 0905 08/01/11 0633 07/31/11 1946  WBC 7.1 5.4 6.5  HGB 12.8* 12.6* 13.2  HCT 38.5* 37.6* 40.0  PLT 79* 79* 91*   BMET  Lab 08/04/11 2335 08/03/11 1110 08/02/11 0905  NA 137 137 137  K 3.9 4.0 3.7  CL 102 104 104  CO2 28 24 26   BUN 24* 14 14  CREATININE 1.20 1.07 0.95  LABGLOM -- -- --  GLUCOSE 112* -- --  CALCIUM 8.8 9.0 8.6    Medications: Scheduled Meds:    . aspirin EC  325 mg Oral Q2000  . azithromycin  500 mg Oral Daily  . donepezil  10 mg Oral QAC supper  . ezetimibe  10 mg Oral Q2000  . mulitivitamin with minerals  1 tablet Oral Daily  . potassium chloride  20 mEq Oral Once  . simvastatin  40 mg Oral Q2000   Continuous Infusions:   PRN Meds:.acetaminophen, acetaminophen, guaiFENesin  Assessment and Plan: Revis Whalin is a 78 y.o. male presenting with inability to walk, increase decline in his  baseline dementia.   1. PNA: Afebrile. No SOB, good Oxygenation.  - BCx negative. - on Azithromycin now PO  2. Dementia: continue aricept. Evaluated last night per Neuroloy. No further intervention needed. Recommended f/u 2-3 weeks after discharge. AMS resolved   3. Hx of HTN; no home meds. BP with sometimes high systolic.  - continue monitoring and if isolated systolic hypertension more than 160 will treat as inpatient other than that we will recommend outpatient f/u.  5. Sinus Bradycardia and PVC's: Cardiology consulted. Not further cardiac evaluation or  pharmacologic intervention needed at this time. Recommended keep K greater than 4. Bmet yesterday K on 3.4, repleted and pending Bmet today.  6. Hyperlipidemia: Last Lipid profile recently and WNL  -Continue Zetia and Simvastatin.   7. Thrombocytopenia: stable.   FEN/GI : HH diet. Saline Lock Prophylaxis: Heparin  Dispo: pending PT/OT evaluation to discharge home   D. [place] Rolene Arbour, MD PGY1, [facility] Medicine Teaching Service Pager [phone] 08/05/2011

## 2011-08-06 DIAGNOSIS — F0390 Unspecified dementia without behavioral disturbance: Secondary | ICD-10-CM

## 2011-08-06 DIAGNOSIS — R5381 Other malaise: Secondary | ICD-10-CM

## 2011-08-06 DIAGNOSIS — J159 Unspecified bacterial pneumonia: Secondary | ICD-10-CM

## 2011-08-06 MED ORDER — GUAIFENESIN 200 MG PO TABS: 400.0000 mg | ORAL_TABLET | Freq: Four times a day (QID) | ORAL | Status: AC | PRN

## 2011-08-06 NOTE — Progress Notes (Signed)
Physical Therapy Treatment Patient Details Name: Arthur Lane MRN: [id number] DOB: [birth year] Today's Date: 08/06/2011  PT Assessment/Plan  PT - Assessment/Plan Comments on Treatment Session: Patient s/p metabolic encephalopathy. Will benefit from PT to address balance and endurance issues.  Continues to need NHP. PT Plan: Discharge plan remains appropriate;Frequency remains appropriate PT Frequency: Min 3X/week Recommendations for Other Services: OT consult Follow Up Recommendations: Skilled nursing facility;Supervision/Assistance - 24 hour Equipment Recommended: Defer to next venue PT Goals  Acute Rehab PT Goals PT Goal Formulation: With family PT Goal: Supine/Side to Sit - Progress: Progressing toward goal PT Goal: Sit to Supine/Side - Progress: Progressing toward goal PT Goal: Sit to Stand - Progress: Progressing toward goal PT Goal: Stand to Sit - Progress: Progressing toward goal PT Goal: Ambulate - Progress: Progressing toward goal  PT Treatment Precautions/Restrictions  Precautions Precautions: Fall Required Braces or Orthoses: No Restrictions Weight Bearing Restrictions: No Mobility (including Balance) Bed Mobility Rolling Left: 4: Min assist;With rail Rolling Left Details (indicate cue type and reason): max cuing for attention to task and safety, sequencing Left Sidelying to Sit: 4: Min assist;With rails;HOB flat Left Sidelying to Sit Details (indicate cue type and reason): assist to bring trunk over BOS.   Supine to Sit: 4: Min assist;With rails;HOB flat Supine to Sit Details (indicate cue type and reason): cues to sequence movement Sitting - Scoot to Edge of Bed: 4: Min assist Sitting - Scoot to [place] of Bed Details (indicate cue type and reason): using pad Sit to Supine: Not Tested (comment) Transfers Sit to Stand: 1: +2 Total assist;Patient percentage (comment);From elevated surface;With upper extremity assist;From bed (pt = 60%) Sit to Stand Details (indicate  cue type and reason): Patient with heavy posterior lean initially needing facilitation for assisting with postural stability.   Stand to Sit: 4: Min assist;With upper extremity assist;With armrests;To chair/3-in-1 Stand to Sit Details: Patient needed max cues and assist for hand placement.   Ambulation/Gait Ambulation/Gait: Yes Ambulation/Gait Assistance: 1: +2 Total assist;Patient percentage (comment) (pt = 70%) Ambulation/Gait Assistance Details (indicate cue type and reason): Patient needed constant facilitation at hips to decr posterior lean.  Patient leaning posteriorly throughout session with ambulation needing facilitation for anterior weight shift.   Ambulation Distance (Feet): 175 Feet Assistive device: Rolling walker Gait Pattern: Step-through pattern;Decreased stride length;Trunk flexed Stairs: No Wheelchair Mobility Wheelchair Mobility: No  Posture/Postural Control Posture/Postural Control: Postural limitations Postural Limitations: Flexed trunk during standing Balance Balance Assessed: Yes Static Sitting Balance Static Sitting - Balance Support: Bilateral upper extremity supported;Feet supported Static Sitting - Level of Assistance: 5: Stand by assistance Dynamic Standing Balance Dynamic Standing - Balance Support: Bilateral upper extremity supported;During functional activity Dynamic Standing - Level of Assistance: 1: +2 Total assist;Patient percentage (comment) (pt = 70%) Dynamic Standing - Balance Activities: Lateral lean/weight shifting;Forward lean/weight shifting;Reaching across midline   End of Session PT - End of Session Equipment Utilized During Treatment: Gait belt Activity Tolerance: Patient tolerated treatment well Patient left: in chair;with call bell in reach Nurse Communication: Mobility status for transfers;Mobility status for ambulation General Behavior During Session: [facility] for tasks performed Cognition: Impaired, at baseline  INGOLD,Samul Mcinroy 08/06/2011,  1:29 PM  [facility] Acute Rehabilitation [phone] (pager)

## 2011-08-06 NOTE — Progress Notes (Signed)
Insurance , [employer], will not approve inpatient acute rehab admission. I have spoken with patient and his wife and they are aware. Wife would like to pursue SNF at [facility]. I will alert physician and SW. Please call with any questions. Pager [phone]

## 2011-08-06 NOTE — Discharge Summary (Signed)
Physician Discharge Summary  Patient ID: Arthur Lane [id number] [birth year] 78 y.o.  Admit date: 07/31/2011 Discharge date: 08/07/2011  PCP: Laurena Slimmer, MD, MD   Discharge Diagnosis: 1. Dementia 2. PNA 3. HTN 4. HLD 5. Thrombocytopenia.    Discharge Medications  Phillips, [place]  Home Medication Instructions [id number]   Printed on:08/06/11 1414  Medication Information                    donepezil (ARICEPT) 10 MG tablet Take 10 mg by mouth daily.            aspirin EC 325 MG tablet Take 325 mg by mouth daily.           Prenatal MV-Min-Fe Fum-FA-DHA (CENTRUM SPECIALIST PRENATAL PO) Take 1 capsule by mouth daily.           ezetimibe (ZETIA) 10 MG tablet Take 10 mg by mouth daily.           simvastatin (ZOCOR) 40 MG tablet Take 40 mg by mouth every evening.              Consults: Neurology, cardiology, OT, PT, SW   Labs: CBC  Lab 08/02/11 0905 08/01/11 0633 07/31/11 1946  WBC 7.1 5.4 6.5  HGB 12.8* 12.6* 13.2  HCT 38.5* 37.6* 40.0  PLT 79* 79* 91*   BMET  Lab 08/05/11 1116 08/04/11 2335 08/03/11 1110 08/01/11 0633 07/31/11 1946  NA 137 137 137 -- --  K 4.0 3.9 4.0 -- --  CL 103 102 104 -- --  CO2 25 28 24  -- --  BUN 25* 24* 14 -- --  CREATININE 1.11 1.20 1.07 -- --  CALCIUM 9.0 8.8 9.0 -- --  PROT -- -- -- 6.0 6.3  BILITOT -- -- -- 1.4* 1.2  ALKPHOS -- -- -- 78 81  ALT -- -- -- 16 18  AST -- -- -- 22 23  GLUCOSE 109* 112* 108* -- --     Procedures/Imaging:  Dg Chest 2 View 08/01/2011   IMPRESSION: Persistent retrocardiac atelectasis/infiltrate.  Cardiomegaly with vascular congestion.  Original Report Authenticated By: ERIC A. MANSELL, M.D.   Dg Chest 2 View 07/31/2011  IMPRESSION:  1.  Left base opacity consistent with infiltrate and/or atelectasis.  Original Report Authenticated By: Rosealee Albee, M.D.   Ct Head Wo Contrast 07/31/2011  * IMPRESSION: 1.  No acute intracranial abnormalities. 2.  Mild cerebral and cerebellar  atrophy. 3.  Mild mucosal thickening in the ethmoid sinuses bilaterally and right maxillary sinus which may and relate to chronic sinus disease.  Original Report Authenticated By: Florencia Reasons, M.D.   Physical Exam at Discharge: Gen: No in acute distress, pleasant and cooperative.  HEENT: Moist mucous membranes.  CV: Regular rate and rhythm, no murmurs rubs or gallops  PULM: Clear to auscultation bilaterally. No wheezes/rales/rhonchi  ABD: Soft, non tender, non distended, normal bowel sounds  EXT: No edema. Has 3 excoriations on left upper extremity in the process of healing. No erythema or drainage  Neuro: Alert and oriented X3. Cooperative with exam . CN II to XII intact. No motor or sensorial focalization. Pt has normal reflexes and good range of motion. Normal finger to nose coordination  Brief Hospital Course Arthur Lane is a 78 y.o. male admitted with inability to walk, increase decline in his baseline dementia.  1. PNA: He was mildly febrile and had rhinorrhea on admission, but  pt never had O2 requirement, no SOB, no cough or wheezes, normal  wbc. CXR showed infiltration vs atelectasis that dis not improved after hydration and due to the sudden decline in his condition we decide dto do BCx and start empiric abx coverage. PT was on Rocephin and azithro IV and was changed to PO azithro after clinical improvement. Pt is discharge after 7 days course abx.   2. Dementia: continue aricept. Evaluated last night per Neuroloy. No further intervention needed. Recommended f/u 2-3 weeks after discharge. AMS resolved. 3. Hx of HTN; no home meds. BP with sometimes high systolic. Recommend f/u as outpatient and evaluate for possibility or start antihypertensive treatment. 5. Sinus Bradycardia and PVC's: Cardiology consulted. Not further cardiac evaluation or pharmacologic intervention needed at this time. Recommended keep K greater than 4.  6. Hyperlipidemia: Last Lipid profile recently and WNL   Continue Zetia and Simvastatin and f/u as outpatient. 7. Thrombocytopenia: stable.  8. Deconditioning: Wife very anxious about going back home with pt not fully physically recovered. Pt prior admission was even driving(?). Physical therapy consulted for possible inpatient rehab vs SNF.  Pt is discharge to SNFon stable medical condition.   D. [place] Rolene Arbour, MD  [facility] 08/07/2011

## 2011-08-06 NOTE — Progress Notes (Signed)
Occupational Therapy Treatment Patient Details Name: Arthur Lane MRN: [id number] DOB: 07/01/1923 Today's Date: 08/06/2011  OT Assessment/Plan OT Assessment/Plan Comments on Treatment Session: pt was fatigued s/p PT session, but gave good effort to OT session Recommendations for Other Services: Rehab consult Follow Up Recommendations: Inpatient Rehab Equipment Recommended: Defer to next venue     OT Treatment Precautions/Restrictions  Restrictions Weight Bearing Restrictions: No   ADL ADL Toileting - Hygiene: Simulated;+1 Total assistance Toileting - Hygiene Details (indicate cue type and reason): pt with posterior lean  during standing task.  Pt not able to self correct and relied heavily on OT to correct balance. ADL Comments: Pt had trouble this OT visit tracking with both eyes to find items in his enviroment. Pt was challeged by this task.Wife not sure pt understood directions.  OT will re assess this next OT visit.  During vision task OT did note pt did not blink to threat.  Pt was able to rthe time and see the clock on the wall. Mobility  Transfers Sit to Stand: 2: Max assist Sit to Stand Details (indicate cue type and reason): Pt with heavy posterior lean.  Pt had already worked with PT today in which fatigue may be playing a factor this OT visit Stand to Sit: 2: Max assist Stand to Sit Details: Pt held onto walker with stand to sit and would not not release walker prior to sitting. Explained safety concern with this.      End of Session OT - End of Session Activity Tolerance: Patient limited by fatigue Patient left: in chair  Weltha Cathy, [place]  08/06/2011, 12:31 PM

## 2011-08-06 NOTE — Progress Notes (Signed)
                                       [facility] PROGRESS NOTE    SUBJECTIVE   No complaints. His wife states he was able to walk to the bathroom with minimal assistance yesterday.  His mentation has improved significantly.   OBJECTIVE   Vital signs in last 24 hours: Temp:  [97.5 F (36.4 C)-98.7 F (37.1 C)] 97.5 F (36.4 C) (03/25 0500) Pulse Rate:  [53-78] 53  (03/25 0500) Resp:  [17-18] 18  (03/25 0500) BP: (127-160)/(78-91) 151/80 mmHg (03/25 0500) SpO2:  [93 %-97 %] 94 % (03/25 0500) Weight:  [65.3 kg (143 lb 15.4 oz)] 65.3 kg (143 lb 15.4 oz) (03/25 0500)  Intake/Output from previous day: 03/24 0701 - 03/25 0700 In: 240 [P.O.:240] Out: 300 [Urine:300] Intake/Output this shift:   Nutritional status: Cardiac  Past Medical History  Diagnosis Date  . Colonic polyp   . Diverticulosis of colon   . HLD (hyperlipidemia)   . HTN (hypertension)   . Primary pulmonary HTN   . Benign hypertensive heart disease without heart failure   . CAD (coronary artery disease)   . Pneumonia   . Cancer     hx of skin ca  . Arthritis   . DEMENTIA     Neurologic Exam:  Mental Status: Alert, oriented, thought content appropriate.  Speech fluent without evidence of aphasia. Able to follow 3 step commands but is hard of hearing.   Cranial Nerves: II-Visual fields grossly intact. III/IV/VI-Extraocular movements intact.  Pupils reactive bilaterally. V/VII-Smile symmetric VIII-has difficulty hearing IX/X-normal gag XI-bilateral shoulder shrug XII-midline tongue extension Motor: 5/5 bilaterally with normal tone and bulk Sensory: Pinprick and light touch intact throughout, bilaterally--he does have decreased vibratory sensation in feet and decreased proprioception at his toes. Deep Tendon Reflexes: 1+ and symmetric throughout Plantars: Downgoing bilaterally Cerebellar: Normal finger-to-nose, normal heel-to-shin test.     Lab Results: Lab Results  Component Value  Date/Time   CHOL 114 08/08/2010  8:48 AM   Lipid Panel No results found for this basename: CHOL,TRIG,HDL,CHOLHDL,VLDL,LDLCALC in the last 72 hours  Studies/Results: No results found.  Medications:     Scheduled:   . aspirin EC  325 mg Oral Q2000  . azithromycin  500 mg Oral Daily  . donepezil  10 mg Oral QAC supper  . ezetimibe  10 mg Oral Q2000  . mulitivitamin with minerals  1 tablet Oral Daily  . simvastatin  40 mg Oral Q2000    Assessment/Plan:   Patient Active Hospital Problem List: Metabolic encephalopathy  Assessment: Patient seems to continue to improve.  Plan: Continue to work with therapy (CIR is looking at patient at this time). No further intervention recommended at this time. Followup with [facility] in 2-3 weeks after discharge from the hospital.     Felicie Morn PA-C [facility] [phone]  08/06/2011, 9:30 AM

## 2011-08-06 NOTE — Progress Notes (Signed)
FMTS Attending Daily Note: Reem Fleury MD [phone] pager office [phone] I have discussed this patient with the resident and reviewed the assessment and plan as documented above. I agree wit the resident's findings and plan.  

## 2011-08-06 NOTE — Progress Notes (Signed)
Daily Progress Note Family Medicine Teaching Service PGY-1   Patient name: Arthur Lane  Medical record [id number] Date of birth:[birth year] Age: 78 y.o. Gender: male  LOS: 6 days   Subjective: Continue to improve. Good appetite. Slept all night. No agitation reported. Afebrile. Yesterday he went to the bathroom assisted by son and wife. Adequate BM.  Objective: Vitals: Patient Vitals for the past 24 hrs: Filed Vitals:   08/06/11 0500  BP: 151/80  Pulse: 53  Temp: 97.5 F (36.4 C)  Resp: 18    Physical Exam: Gen: no in acute distress, pleasant and cooperative. HEENT: Moist mucous membranes.  CV: Regular rate and rhythm, no murmurs rubs or gallops  PULM: Clear to auscultation bilaterally. No wheezes/rales/rhonchi  ABD: Soft, non tender, non distended, normal bowel sounds  EXT: No edema. Has 3 excoriations on left upper extremity in the process of healing. No erythema or drainage Neuro: Alert and oriented X3. Cooperative with exam . CN II to XII intact.  No motor or sensorial focalization. Pt has normal reflexes and good range of motion. Normal finger to nose coordination.  Labs and imaging:  CBC  Lab 08/02/11 0905 08/01/11 0633 07/31/11 1946  WBC 7.1 5.4 6.5  HGB 12.8* 12.6* 13.2  HCT 38.5* 37.6* 40.0  PLT 79* 79* 91*   BMET  Lab 08/05/11 1116 08/04/11 2335 08/03/11 1110  NA 137 137 137  K 4.0 3.9 4.0  CL 103 102 104  CO2 25 28 24   BUN 25* 24* 14  CREATININE 1.11 1.20 1.07  LABGLOM -- -- --  GLUCOSE 109* -- --  CALCIUM 9.0 8.8 9.0    Medications: Scheduled Meds:    . aspirin EC  325 mg Oral Q2000  . azithromycin  500 mg Oral Daily  . donepezil  10 mg Oral QAC supper  . ezetimibe  10 mg Oral Q2000  . mulitivitamin with minerals  1 tablet Oral Daily  . simvastatin  40 mg Oral Q2000   Continuous Infusions:   PRN Meds:.acetaminophen, acetaminophen, guaiFENesin  Assessment and Plan: Arthur Lane is a 78 y.o. male presenting with inability to  walk, increase decline in his baseline dementia.   1. PNA: Afebrile. No SOB, good Oxygenation.  - BCx negative. - on Azithromycin PO to complete 7 days treatment.  2. Dementia: continue aricept. Evaluated last night per Neuroloy. No further intervention needed. Recommended f/u 2-3 weeks after discharge. AMS resolved.  3. Hx of HTN; no home meds. BP with sometimes high systolic.  - continue monitoring and if isolated systolic hypertension more than 160 will treat as inpatient other than that we will recommend outpatient f/u.  5. Sinus Bradycardia and PVC's: Cardiology consulted. Not further cardiac evaluation or  pharmacologic intervention needed at this time. Recommended keep K greater than 4. Bmet yesterday K on 3.4, repleted and pending Bmet today.  6. Hyperlipidemia: Last Lipid profile recently and WNL  -Continue Zetia and Simvastatin.   7. Thrombocytopenia: stable.   FEN/GI : HH diet. Saline Lock Prophylaxis: Heparin  Dispo: pending rehab consul for inpatient rehab.   D. [place] Rolene Arbour, MD PGY1, [facility] Medicine Teaching Service Pager [phone] 08/06/2011

## 2011-08-06 NOTE — Discharge Instructions (Signed)

## 2011-08-06 NOTE — Consult Note (Signed)
Physical Medicine and Rehabilitation Consult Reason for Consult: Altered mental status Referring Phsyician: Family medicine Arthur Lane is an 78 y.o. male.   HPI:  78 year old right-handed male with history of dementia and ambulating without devices as well as driving short distances. Admitted March 20 with altered mental status and inability to stand from a sitting position. Also noted to day history of nonbloody diarrhea and low-grade fever of 100.5. CT of the head showed no acute intracranial changes there was mild cerebral and cerebellar atrophy. Chest x-ray with left base opacity consistent with infiltrate and/or atelectasis and currently maintained on Zithromax until March 26. Bouts of sinus bradycardia at 38 beats per minute cardiology has been consulted and await followup. Neurology followup for altered mental status patient close to cognitive baseline per wife as patient remains on Aricept. Physical and occupational therapy treatments ongoing. M.D. in family request physical medicine rehabilitation consult to consider inpatient rehabilitation services. Review of Systems  Respiratory: Positive for cough.   Gastrointestinal: Positive for diarrhea.  Psychiatric/Behavioral: Positive for memory loss.  All other systems reviewed and are negative.   Past Medical History  Diagnosis Date  . Colonic polyp   . Diverticulosis of colon   . HLD (hyperlipidemia)   . HTN (hypertension)   . Primary pulmonary HTN   . Benign hypertensive heart disease without heart failure   . CAD (coronary artery disease)   . Pneumonia   . Cancer     hx of skin ca  . Arthritis   . DEMENTIA    Past Surgical History  Procedure Date  . Coronary artery bypass graft 1980  . Laparoscopic cholecystectomy   . Ptca    History reviewed. No pertinent family history. Social History:  reports that he has quit smoking. He does not have any smokeless tobacco history on file. He reports that he drinks alcohol. He  reports that he does not use illicit drugs. Allergies: No Known Allergies Medications Prior to Admission  Medication Dose Route Frequency Provider Last Rate Last Dose  . acetaminophen (TYLENOL) tablet 650 mg  650 mg Oral Q6H PRN [facility]armys [place] Peaches, MD   650 mg at 08/01/11 1308   Or  . acetaminophen (TYLENOL) suppository 650 mg  650 mg Rectal Q6H PRN [facility] [place] Peaches, MD      . aspirin EC tablet 325 mg  325 mg Oral Q2000 Sanjuana Letters, MD   325 mg at 08/05/11 2102  . azithromycin (ZITHROMAX) tablet 500 mg  500 mg Oral Daily Andrena Mews, DO   500 mg at 08/05/11 1043  . donepezil (ARICEPT) tablet 10 mg  10 mg Oral QAC supper Sanjuana Letters, MD   10 mg at 08/05/11 1629  . ezetimibe (ZETIA) tablet 10 mg  10 mg Oral Q2000 Sanjuana Letters, MD   10 mg at 08/05/11 2102  . guaiFENesin tablet 400 mg  400 mg Oral Q6H PRN Rodolph Bong, MD      . mulitivitamin with minerals tablet 1 tablet  1 tablet Oral Daily [facility] [place] Peaches, MD   1 tablet at 08/05/11 1043  . potassium chloride SA (K-DUR,KLOR-CON) CR tablet 20 mEq  20 mEq Oral Once Lonia Skinner, MD   20 mEq at 08/05/11 0552  . potassium chloride SA (K-DUR,KLOR-CON) CR tablet 40 mEq  40 mEq Oral Q4H Josalyn Funches, MD   40 mEq at 08/01/11 1308  . potassium chloride SA (K-DUR,KLOR-CON) CR tablet 40 mEq  40 mEq Oral Once Andrena Mews, DO   40 mEq at 08/02/11 1135  . simvastatin (ZOCOR) tablet 40 mg  40 mg Oral Q2000 Sanjuana Letters, MD   40 mg at 08/05/11 2102  . DISCONTD: 0.9 %  sodium chloride infusion   Intravenous Continuous [facility] [place] Peaches, MD 100 mL/hr at 08/01/11 0152    . DISCONTD: aspirin EC tablet 325 mg  325 mg Oral Daily [facility] [place] Peaches, MD   325 mg at 08/01/11 9629  . DISCONTD: azithromycin (ZITHROMAX) 500 mg in dextrose 5 % 250 mL IVPB  500 mg Intravenous Q24H Lonia Skinner, MD   500 mg at 08/02/11 1218  . DISCONTD: cefTRIAXone (ROCEPHIN) 1 g in dextrose 5 % 50  mL IVPB  1 g Intravenous Q24H Lonia Skinner, MD   1 g at 08/02/11 1135  . DISCONTD: dextrose 5 %-0.45 % sodium chloride infusion   Intravenous Continuous [facility] [place] Peaches, MD 75 mL/hr at 08/03/11 0022    . DISCONTD: donepezil (ARICEPT) tablet 10 mg  10 mg Oral Daily [facility] [place] Peaches, MD      . DISCONTD: ezetimibe (ZETIA) tablet 10 mg  10 mg Oral Daily [facility] [place] Peaches, MD      . DISCONTD: heparin injection 5,000 Units  5,000 Units Subcutaneous Q8H [facility] [place] Peaches, MD      . DISCONTD: simvastatin (ZOCOR) tablet 40 mg  40 mg Oral QPM [facility] [place] Peaches, MD       Medications Prior to Admission  Medication Sig Dispense Refill  . donepezil (ARICEPT) 10 MG tablet Take 10 mg by mouth daily.       [name] ezetimibe (ZETIA) 10 MG tablet Take 10 mg by mouth daily.      . simvastatin (ZOCOR) 40 MG tablet Take 40 mg by mouth every evening.        Home: Home Living Lives With: Spouse Receives Help From: Family Type of Home: House Bathroom Toilet: Standard Additional Comments: pt with dementia waxing and waning.  Per wife pt has good days where he is fairly Independent and bad days where he relies on her for care.    Functional History: Prior Function Level of Independence: Needs assistance with ADLs;Needs assistance with homemaking;Needs assistance with gait;Needs assistance with tranfers Driving: Yes (on "good" days) Functional Status:  Mobility: Bed Mobility Bed Mobility: Yes Rolling Left: With rail;3: Mod assist Rolling Left Details (indicate cue type and reason): max cueing for attention to task and safety, sequencing Left Sidelying to Sit: 3: Mod assist;With rails;HOB elevated (comment degrees) (~40degrees) Left Sidelying to Sit Details (indicate cue type and reason): assist to maintain bilateral feet off EOB and Assist to bring trunk over BOS Supine to Sit: 2: Max assist Supine to Sit Details (indicate cue type and reason): cues for participation,  pt attempts to A, however needs constant re-orienting to task Sitting - Scoot to Edge of Bed: 2: Max assist Sit to Supine: 2: Max assist Sit to Supine - Details (indicate cue type and reason): cues for participation, however pt with minimally able to A with retrun to bed.   Transfers Transfers: No Sit to Stand: 2: Max assist;From bed Sit to Stand Details (indicate cue type and reason): Attempted sit to stand x 4 at EOB. Pt with plantar flexion, knee extension and hip extension preventing full upright standing. VC for pt to lean forward. Pt did much better  with sit to stand when given cues to lean forward and told, "OK. Stand up, Ed." Would benefit from +2 next session. Stand to Sit: 2: Max assist;To bed Ambulation/Gait Ambulation/Gait: No Stairs: No Wheelchair Mobility Wheelchair Mobility: No  ADL: ADL Eating/Feeding: Not assessed Grooming: Performed;Wash/dry face;Minimal assistance Grooming Details (indicate cue type and reason): Min A to maintain sitting balance while seated EOB as pt with tendency for posterior lean Where Assessed - Grooming: Sitting, bed Upper Body Bathing: Simulated;Minimal assistance Upper Body Bathing Details (indicate cue type and reason): VC to remain on task as pt with decreased attention span Where Assessed - Upper Body Bathing: Sitting, bed Lower Body Bathing: Simulated;Maximal assistance Where Assessed - Lower Body Bathing: Sitting, bed;Lean right and/or left Upper Body Dressing: Simulated;Moderate assistance Where Assessed - Upper Body Dressing: Sitting, bed Lower Body Dressing: Simulated;+1 Total assistance Where Assessed - Lower Body Dressing: Rolling right and/or left Toilet Transfer: Not assessed Toileting - Clothing Manipulation: Not assessed Toileting - Hygiene: Not assessed Tub/Shower Transfer: Not assessed Ambulation Related to ADLs: NT secondary pt unable to complete full sit to stand from EOB ADL Comments: Pt with heavy posterior lean, hip  extension, knee extension and plantar flexion which impedes sitting balance at EOB as well as sit to stand transfer. Performed sitting balance activities at EOB, encouraging forward flexion (to feet) and reaching activities with return to center and upright sitting. Pt able to sit EOB with supervision as long as he kept bilateral hand on knees  Cognition: Cognition Orientation Level: Oriented X4 Cognition Overall Cognitive Status: History of cognitive impairments History of Cognitive Impairment: Decline in baseline functioning Attention: Impaired Current Attention Level: Sustained (~74min) Orientation Level: Oriented X4 Following Commands: Follows one step commands inconsistently  Blood pressure 151/80, pulse 53, temperature 97.5 F (36.4 C), temperature source Oral, resp. rate 18, height 5\' 9"  (1.753 m), weight 65.3 kg (143 lb 15.4 oz), SpO2 94.00%. Physical Exam  Nursing note and vitals reviewed. Constitutional: He is oriented to person, place, and time. He appears well-developed.  HENT:  Head: Normocephalic.  Neck: Normal range of motion. Neck supple. No thyromegaly present.  Cardiovascular: Regular rhythm.        Heart rate currently at 60  Pulmonary/Chest: Breath sounds normal. He has no wheezes.  Abdominal: He exhibits no distension. There is no tenderness.  Musculoskeletal: He exhibits no edema.  Neurological: He is alert and oriented to person, place, and time.       Follow 3 step commands. He follows all instructions for me in the bed. He does have occasional apraxia. He is oriented to place and encounter for cues to call the day. He is generally pleasant. Strength is grossly 4/5 in all 4 limbs no focal sensory loss.  Skin: Skin is warm and dry.  Psychiatric: He has a normal mood and affect.    Results for orders placed during the hospital encounter of 07/31/11 (from the past 24 hour(s))  BASIC METABOLIC PANEL     Status: Abnormal   Collection Time   08/05/11 11:16 AM       Component Value Range   Sodium 137  135 - 145 (mEq/L)   Potassium 4.0  3.5 - 5.1 (mEq/L)   Chloride 103  96 - 112 (mEq/L)   CO2 25  19 - 32 (mEq/L)   Glucose, Bld 109 (*) 70 - 99 (mg/dL)   BUN 25 (*) 6 - 23 (mg/dL)   Creatinine, Ser 1.61  0.50 - 1.35 (mg/dL)   Calcium  9.0  8.4 - 10.5 (mg/dL)   GFR calc non Af Amer 59 (*) >90 (mL/min)   GFR calc Af Amer 68 (*) >90 (mL/min)   No results found.  Assessment/Plan: Diagnosis: Pneumonia with deconditioning in the setting of dementia with acute exacerbation 1. Does the need for close, 24 hr/day medical supervision in concert with the patient's rehab needs make it unreasonable for this patient to be served in a less intensive setting? Yes 2. Co-Morbidities requiring supervision/potential complications: CAD, bradycardia, hypertension 3. Due to bladder management, bowel management, safety, skin/wound care, disease management, medication administration, pain management and patient education, does the patient require 24 hr/day rehab nursing? Yes 4. Does the patient require coordinated care of a physician, rehab nurse, PT (1-2 hrs/day, 5 days/week), OT (1-2 hrs/day, 5 days/week) and SLP (1-2 hrs/day, 5 days/week) to address physical and functional deficits in the context of the above medical diagnosis(es)? Yes Addressing deficits in the following areas: balance, endurance, locomotion, strength, transferring, bowel/bladder control, bathing, dressing, feeding, grooming, toileting, cognition, speech, language and psychosocial support 5. Can the patient actively participate in an intensive therapy program of at least 3 hrs of therapy per day at least 5 days per week? Yes 6. The potential for patient to make measurable gains while on inpatient rehab is good 7. Anticipated functional outcomes upon discharge from inpatients are supervision PT, supervision to minimal assistance OT, supervision to minimal assist SLP 8. Estimated rehab length of stay to reach the  above functional goals is: 1 week 9. Does the patient have adequate social supports to accommodate these discharge functional goals? Yes 10. Anticipated D/C setting: Home 11. Anticipated post D/C treatments: HH therapy 12. Overall Rehab/Functional Prognosis: good  RECOMMENDATIONS: This patient's condition is appropriate for continued rehabilitative care in the following setting: CIR Patient has agreed to participate in recommended program. Yes Note that insurance prior authorization may be required for reimbursement for recommended care.  Comment: I spoke at length with the patient and his wife. His wife states that he has not had his baseline cognitively. He is not at his baseline either from a physical standpoint. I would be willing to give him a chance on the inpatient rehabilitation unit to reach supervision goals. I advised wife of the expectations of an inpatient rehabilitation admission and she expressed an understanding.   Ranelle Oyster M.D. 08/06/2011

## 2011-08-07 LAB — CULTURE, BLOOD (ROUTINE X 2)
Culture  Setup Time: 201303201934
Culture: NO GROWTH
Culture: NO GROWTH

## 2011-08-07 NOTE — Progress Notes (Signed)
I met with patient and spouse at bedside. Insurance will not approve inpatient acute rehab  Per onsite reviewer. I did provide spouse with on site reviewer's contact number so that she can clarify denial decision for CIR. SNF is still recommended at this time. I did alert SW of this information. Please call with any questions. Pager [phone]

## 2011-08-07 NOTE — Progress Notes (Signed)
Clinical Child psychotherapist (CSW) spoke with pt spouse this a.m regarding placement options for pt. As CSW informed early this am that pt insurance has denied CIR. Pt spouse stated she is awaiting a call back from CIR Coordinator regarding whether insurance is indeed denying CIR as pt spouse stated her insurance stated they have not denied CIR. CSW encouraged spouse to take pt insurance card to financial counseling as [facility] AARP is what is showing in the system. CSW confirmed pt policy number and card information. CSW informed pt spouse that unfortunately [facility] does not accept [facility] and therefore another facility would have to be selected. Spouse stated she would be agreeable to [facility], preferably a private room, If insurance does deny CIR. CSW has made a call to [facility] however will await confirmation from CIR prior to facilitating with dc to a SNF.  Theresia Bough, MSW, Theresia Majors [phone]

## 2011-08-07 NOTE — Progress Notes (Signed)
Clinical Social Worker (CSW) spoke with pt spouse who is aware that insurance will not cover CIR. Pt spouse very understanding and appreciative of clarity. Spouse would like placement at [facility]. CSW has contacted facility who will submit for authorization and set up a time to complete paperwork with spouse today. CSW will inform MD and facilitate dc to [facility] today.  Theresia Bough, MSW, Theresia Majors [phone]

## 2011-08-07 NOTE — Progress Notes (Signed)
Clinical Child psychotherapist (CSW) faxed dc summary to facility, informed pt and spouse that pt ready for dc. CSW placed dc packet in pt wall-a-roo and contacted [facility] for a non emergency ambulance. No further needs addressed. CSW signing off.  Theresia Bough, MSW, Theresia Majors [phone]

## 2011-08-07 NOTE — Progress Notes (Signed)
Clinical Social Work Department CLINICAL SOCIAL WORK PLACEMENT NOTE 08/07/2011  Patient:  Arthur Lane, Arthur Lane  Account Number:  [id number] Admit date:  07/31/2011  Clinical Social Worker:  Theresia Bough, Theresia Majors  Date/time:  08/07/2011 01:48 PM  Clinical Social Work is seeking post-discharge placement for this patient at the following level of care:   SKILLED NURSING   (*CSW will update this form in Epic as items are completed)   08/05/2011  Patient/family provided with [facility] Department of Clinical Social Work's list of facilities offering this level of care within the geographic area requested by the patient (or if unable, by the patient's family).    Patient/family informed of their freedom to choose among providers that offer the needed level of care, that participate in Medicare, Medicaid or managed care program needed by the patient, have an available bed and are willing to accept the patient.    Patient/family informed of [facility]' ownership interest in [facility], as well as of the fact that they are under no obligation to receive care at this facility.  PASARR submitted to EDS on 08/05/2011 PASARR number received from EDS on 08/05/2011  FL2 transmitted to all facilities in geographic area requested by pt/family on  08/05/2011 FL2 transmitted to all facilities within larger geographic area on   Patient informed that his/her managed care company has contracts with or will negotiate with  certain facilities, including the following:     Patient/family informed of bed offers received:  08/14/2011 Patient chooses bed at [facility] Physician recommends and patient chooses bed at    Patient to be transferred to [facility] on  08/07/2011 Patient to be transferred to facility by [facility]  The following physician request were entered in Epic:   Additional Comments:

## 2011-08-07 NOTE — Progress Notes (Signed)
Pt and wife received d/c instructions. Spouse verbalized understanding. Attempted to call report to [facility]. Will retry again in 5 minutes. Pt IV removed without difficulty. Tele monitor returned to front. Will continue to monitor until patient leaves unit. Ramond Craver, Rn

## 2011-08-07 NOTE — Discharge Summary (Signed)
Family Medicine Teaching Service  Discharge Note : Attending Virgel Haro MD Pager [phone] Office [phone] I have seen and examined this patient, reviewed their chart and discussed discharge planning wit the resident at the time of discharge. I agree with the discharge plan as above.  

## 2011-08-09 ENCOUNTER — Encounter: Payer: Self-pay | Admitting: Physician Assistant

## 2011-08-09 ENCOUNTER — Ambulatory Visit (INDEPENDENT_AMBULATORY_CARE_PROVIDER_SITE_OTHER): Payer: Medicare Other | Admitting: Physician Assistant

## 2011-08-09 VITALS — BP 157/86 | HR 83 | Ht 68.0 in

## 2011-08-09 DIAGNOSIS — I10 Essential (primary) hypertension: Secondary | ICD-10-CM

## 2011-08-09 DIAGNOSIS — R42 Dizziness and giddiness: Secondary | ICD-10-CM

## 2011-08-09 DIAGNOSIS — I119 Hypertensive heart disease without heart failure: Secondary | ICD-10-CM

## 2011-08-09 DIAGNOSIS — I340 Nonrheumatic mitral (valve) insufficiency: Secondary | ICD-10-CM

## 2011-08-09 DIAGNOSIS — I2510 Atherosclerotic heart disease of native coronary artery without angina pectoris: Secondary | ICD-10-CM

## 2011-08-09 DIAGNOSIS — R55 Syncope and collapse: Secondary | ICD-10-CM

## 2011-08-09 DIAGNOSIS — I059 Rheumatic mitral valve disease, unspecified: Secondary | ICD-10-CM

## 2011-08-09 NOTE — Patient Instructions (Signed)
Your physician recommends that you schedule a follow-up appointment in: 3-4 weeks with Dr Jens Som Your physician recommends that you have lab work drawn today [facility]) Your physician has requested that you have a lexiscan myoview. For further information please visit [email or web address] Please follow instruction sheet, as given.  Your physician has recommended that you wear an event monitor. Event monitors are medical devices that record the heart's electrical activity. Doctors most often [place] these monitors to diagnose arrhythmias. Arrhythmias are problems with the speed or rhythm of the heartbeat. The monitor is a small, portable device. You can wear one while you do your normal daily activities. This is usually used to diagnose what is causing palpitations/syncope (passing out).

## 2011-08-09 NOTE — Progress Notes (Signed)
[address] [place], [place]  [address] Phone: [phone] Fax:  [phone]  Date:  08/09/2011   Name:  Donielle Radziewicz       DOB:  [birth year] MRN:  [id number]  PCP:  Dr. Cleta Alberts Primary Cardiologist:  Dr. Olga Millers  Primary Electrophysiologist:  None    History of Present Illness: Yuvin Bussiere is a 78 y.o. male who presents for evaluation of syncope.  He has a history of CAD, status post CABG in 1980, status post prior PCI of the circumflex, hypertension, postural hypotension, chronic dizziness.  He has actually been seen by neurology in the past for dizziness that was felt to be multifactorial.  He was referred to physical therapy.  He was previously followed by Dr. Juanda Chance.  He establish with Dr. Jens Som 5/12.  Last nuclear study 5/10: EF 60%, no evidence for scar or ischemia.  Last echocardiogram 6/12: Mild LVH, EF 60%, grade 2 diastolic dysfunction, very mild AS, possible mild posterior mitral valve prolapse, mild MR, moderate LAE, mild RAE, PASP 47.  He was just discharged from the hospital 3/26 after an admission for pneumonia.  Dr. [place] saw him in consultation secondary to bradycardia and PVCs.  Upon reviewing his chart, his rhythm and rate were fairly stable with prior.  No further recommendations were made.  He is now staying in [facility] for rehabilitation.  Yesterday, after exercising on recumbent bicycle, he suffered a syncopal spell while walking back to his room.  His heart rate was in the 40s.  Oxygen saturation 73%.  I do not have an EKG.  He denies any prodrome.  He does have significant dementia.  He is on Aricept.  He denies chest pain.  He denies significant dyspnea on exertion.  Denies orthopnea, PND or edema.  Past Medical History  Diagnosis Date  . Colonic polyp   . Diverticulosis of colon   . HLD (hyperlipidemia)   . HTN (hypertension)   . Primary pulmonary HTN   . Benign hypertensive heart disease without heart failure   .  CAD (coronary artery disease)   . Pneumonia   . Cancer     hx of skin ca  . Arthritis   . DEMENTIA     Current Outpatient Prescriptions  Medication Sig Dispense Refill  . aspirin EC 325 MG tablet Take 325 mg by mouth daily.      [name] donepezil (ARICEPT) 10 MG tablet Take 10 mg by mouth daily.       [name] ezetimibe (ZETIA) 10 MG tablet Take 10 mg by mouth daily.      [name] guaiFENesin 200 MG tablet Take 2 tablets (400 mg total) by mouth every 6 (six) hours as needed.  30 tablet  1  . Prenatal MV-Min-Fe Fum-FA-DHA (CENTRUM SPECIALIST PRENATAL PO) Take 1 capsule by mouth daily.      . simvastatin (ZOCOR) 40 MG tablet Take 40 mg by mouth every evening.        Allergies: No Known Allergies  History  Substance Use Topics  . Smoking status: Former Games developer  . Smokeless tobacco: Not on file  . Alcohol Use: Yes     rarely     ROS:  Please see the history of present illness.   All other systems reviewed and negative.   PHYSICAL EXAM: VS:  BP 157/86  Pulse 83  Ht 5\' 8"  (1.727 m) Well nourished, well developed, in no acute distress HEENT: normal Neck: no JVD At 90 degrees Cardiac:  normal S1, S2; RRR; no murmur Lungs:  Decreased breath sounds bilaterally, no wheezing, rhonchi or rales Abd: soft, nontender, no hepatomegaly Ext: no edema Skin: warm and dry Neuro:  CNs 2-12 intact, no focal abnormalities noted  EKG:  Sinus rhythm, heart rate 80, normal axis, first degree AV block, PR interval 260 ms, frequent PACs, single PVC  ASSESSMENT AND PLAN:  1. Syncope and collapse  Etiology unclear.  I am somewhat concerned that this occurred after exercise.  However, he does have a history of orthostasis.  He is likely deconditioned from his recent admission to the hospital with pneumonia.  Question if his syncope could be explained by significant orthostatic blood pressure drop. However, with his history of CAD, I will set him up for a Lexiscan Myoview to rule out ischemia.  He has had a history of  bradycardia.  However, he is not significantly bradycardic today.  I will also have him wear a 21 day event monitor.  Check a TSH.  He will return to see Dr. Jens Som in the next 3-4 weeks.  We'll bring him back sooner if needed.   2. CAD (coronary artery disease)  Schedule stress test is noted.   3. Mitral regurgitation  Mild MR and mild aortic stenosis by recent echo.   4. POSTURAL LIGHTHEADEDNESS  Question if this is the etiology for his syncopal spell.   5. HYPERTENSION  His blood pressure was allowed to remain higher given his history of orthostasis.       Luna Glasgow, PA-C  3:54 PM 08/09/2011

## 2011-08-10 ENCOUNTER — Telehealth: Payer: Self-pay | Admitting: *Deleted

## 2011-08-10 LAB — TSH: TSH: 0.862 u[IU]/mL (ref 0.350–4.500)

## 2011-08-10 NOTE — Telephone Encounter (Signed)
Message copied by Tarri Fuller on Fri Aug 10, 2011 11:48 AM ------      Message from: [place], [place] T      Created: Fri Aug 10, 2011  8:37 AM       Please notify patient that the lab results are ok.      Tereso Newcomer, PA-C  8:37 AM 08/10/2011

## 2011-08-10 NOTE — Telephone Encounter (Signed)
lmom labs ok. Arthur Lane  

## 2011-08-15 ENCOUNTER — Ambulatory Visit (HOSPITAL_COMMUNITY): Payer: Medicare Other | Attending: Cardiology | Admitting: Radiology

## 2011-08-15 VITALS — BP 153/102 | Ht 68.0 in | Wt 141.0 lb

## 2011-08-15 DIAGNOSIS — F0390 Unspecified dementia without behavioral disturbance: Secondary | ICD-10-CM | POA: Insufficient documentation

## 2011-08-15 DIAGNOSIS — Z9861 Coronary angioplasty status: Secondary | ICD-10-CM | POA: Insufficient documentation

## 2011-08-15 DIAGNOSIS — R55 Syncope and collapse: Secondary | ICD-10-CM

## 2011-08-15 DIAGNOSIS — Z951 Presence of aortocoronary bypass graft: Secondary | ICD-10-CM | POA: Insufficient documentation

## 2011-08-15 DIAGNOSIS — R002 Palpitations: Secondary | ICD-10-CM | POA: Insufficient documentation

## 2011-08-15 DIAGNOSIS — E785 Hyperlipidemia, unspecified: Secondary | ICD-10-CM | POA: Insufficient documentation

## 2011-08-15 DIAGNOSIS — Z87891 Personal history of nicotine dependence: Secondary | ICD-10-CM | POA: Insufficient documentation

## 2011-08-15 DIAGNOSIS — R42 Dizziness and giddiness: Secondary | ICD-10-CM | POA: Insufficient documentation

## 2011-08-15 DIAGNOSIS — I10 Essential (primary) hypertension: Secondary | ICD-10-CM | POA: Insufficient documentation

## 2011-08-15 MED ORDER — REGADENOSON 0.4 MG/5ML IV SOLN
0.4000 mg | Freq: Once | INTRAVENOUS | Status: AC
  Administered 2011-08-15: 0.4 mg via INTRAVENOUS

## 2011-08-15 MED ORDER — TECHNETIUM TC 99M TETROFOSMIN IV KIT
32.6000 | PACK | Freq: Once | INTRAVENOUS | Status: AC | PRN
  Administered 2011-08-15: 33 via INTRAVENOUS

## 2011-08-15 MED ORDER — TECHNETIUM TC 99M TETROFOSMIN IV KIT
11.0000 | PACK | Freq: Once | INTRAVENOUS | Status: AC | PRN
  Administered 2011-08-15: 11 via INTRAVENOUS

## 2011-08-15 NOTE — Progress Notes (Signed)
[facility] SITE 3 NUCLEAR MED [address] [place] [address] [phone]  Cardiology Nuclear Med Study  Arthur Lane is a 78 y.o. male     MRN : [id number]     DOB: 12-Dec-1923  Procedure Date: 08/15/2011  Nuclear Med Background Indication for Stress Test:  Evaluation for Ischemia, Graft Patency and PTCA Patency History: Significant Dementia, '80 CABG x4, '96 '99 Heart Cath patent grafts except SVG-CFX-Angioplasty, 5/10 MPS: EF: 60% (-), 6/12 ECHO: EF: 60% mild LVH, very mild AS, mild MVP  Cardiac Risk Factors: History of Smoking, Hypertension and Lipids  Symptoms:  Dizziness, Light-Headedness, Palpitations and Syncope   Nuclear Pre-Procedure Caffeine/Decaff Intake:  None NPO After: 8:00am   Lungs:  clear O2 Sat: 98% on room air. IV 0.9% NS with Angio Cath:  22g  IV Site: L Antecubital  IV Started by:  Stanton Kidney, EMT-P  Chest Size (in):  38 Cup Size: n/a  Height: 5\' 8"  (1.727 m)  Weight:  141 lb (63.957 kg)  BMI:  Body mass index is 21.44 kg/(m^2). Tech Comments:  NA    Nuclear Med Study 1 or 2 day study: 1 day  Stress Test Type:  Lexiscan  Reading MD: Marca Ancona, MD  Order Authorizing Provider:  Olga Millers MD Tereso Newcomer PA  Resting Radionuclide: Technetium 71m Tetrofosmin  Resting Radionuclide Dose: 11.0 mCi   Stress Radionuclide:  Technetium 64m Tetrofosmin  Stress Radionuclide Dose: 32.6 mCi           Stress Protocol Rest HR: 54 Stress HR: 93  Rest BP: 153/102 Stress BP: 157/86  Exercise Time (min): n/a METS: n/a   Predicted Max HR: 135 bpm % Max HR: 68.89 bpm Rate Pressure Product: 21308   Dose of Adenosine (mg):  n/a Dose of Lexiscan: 0.4 mg  Dose of Atropine (mg): n/a Dose of Dobutamine: n/a mcg/kg/min (at max HR)  Stress Test Technologist: Milana Na, EMT-P  Nuclear Technologist:  Domenic Polite, CNMT     Rest Procedure:  Myocardial perfusion imaging was performed at rest 45 minutes following the intravenous  administration of Technetium 24m Tetrofosmin. Rest ECG: NSR - Normal EKG  Stress Procedure:  The patient received IV Lexiscan 0.4 mg over 15-seconds.  Technetium 37m Tetrofosmin injected at 30-seconds.  There were no significant changes, chest pressure, abdominal pain, and  occ pvcs with Lexiscan.  Quantitative spect images were obtained after a 45 minute delay. Stress ECG: No significant change from baseline ECG  QPS Raw Data Images:  Normal; no motion artifact; normal heart/lung ratio. Stress Images:  Normal homogeneous uptake in all areas of the myocardium. Rest Images:  Normal homogeneous uptake in all areas of the myocardium. Subtraction (SDS):  There is no evidence of scar or ischemia. Transient Ischemic Dilatation (Normal <1.22):  1.13 Lung/Heart Ratio (Normal <0.45):  0.33  Quantitative Gated Spect Images QGS EDV:  83 ml QGS ESV:  30 ml  Impression Exercise Capacity:  Lexiscan with no exercise. BP Response:  Hypotensive blood pressure response. Clinical Symptoms:  Fatigue, chest pressure.  ECG Impression:  No significant ST segment change suggestive of ischemia. Comparison with Prior Nuclear Study: No significant change from previous study  Overall Impression:  Normal stress nuclear study.  LV Ejection Fraction: 63%.  LV Wall Motion:  NL LV Function; NL Wall Motion  Marca Ancona 08/15/2011 6:20 PM

## 2011-08-16 ENCOUNTER — Telehealth: Payer: Self-pay | Admitting: *Deleted

## 2011-08-16 NOTE — Telephone Encounter (Signed)
Message copied by Tarri Fuller on Thu Aug 16, 2011 11:23 AM ------      Message from: [place], [place] T      Created: Thu Aug 16, 2011 11:21 AM       Please inform patient stress test normal.      Tereso Newcomer, PA-C  11:21 AM 08/16/2011

## 2011-08-16 NOTE — Telephone Encounter (Signed)
lmom stress test normal . Arthur Lane  

## 2011-08-24 ENCOUNTER — Encounter (INDEPENDENT_AMBULATORY_CARE_PROVIDER_SITE_OTHER): Payer: Medicare Other

## 2011-08-24 ENCOUNTER — Encounter: Payer: Self-pay | Admitting: Cardiology

## 2011-08-24 ENCOUNTER — Ambulatory Visit (INDEPENDENT_AMBULATORY_CARE_PROVIDER_SITE_OTHER): Payer: Medicare Other | Admitting: Cardiology

## 2011-08-24 VITALS — BP 151/75 | HR 57 | Ht 68.0 in

## 2011-08-24 DIAGNOSIS — R55 Syncope and collapse: Secondary | ICD-10-CM | POA: Insufficient documentation

## 2011-08-24 NOTE — Assessment & Plan Note (Signed)
Blood pressure mildly elevated. Given history of orthostasis I would be hesitant to add blood pressure medications.

## 2011-08-24 NOTE — Assessment & Plan Note (Signed)
Continue statin. 

## 2011-08-24 NOTE — Patient Instructions (Addendum)
A chest x-ray takes a picture of the organs and structures inside the chest, including the heart, lungs, and blood vessels. This test can show several things, including, whether the heart is enlarges; whether fluid is building up in the lungs; and whether pacemaker / defibrillator leads are still in place.  Your physician has recommended that you wear a cardionet event monitor. Event monitors are medical devices that record the heart's electrical activity. Doctors most often [place] these monitors to diagnose arrhythmias. Arrhythmias are problems with the speed or rhythm of the heartbeat. The monitor is a small, portable device. You can wear one while you do your normal daily activities. This is usually used to diagnose what is causing palpitations/syncope (passing out).  You have been referred to [facility]   Your physician wants you to follow-up in: 4-6 weeks with Dr. Jens Som AFTER he has been seen by pulmonary. You will receive a reminder letter in the mail two months in advance. If you don't receive a letter, please call our office to schedule the follow-up appointment.

## 2011-08-24 NOTE — Assessment & Plan Note (Signed)
Previous episode may have been orthostatic mediated. However he is also having hypoxia by report with ambulation. His sats have been in the high 70s and low 80s. His heart rate is in the 50s by report. I will arrange a CardioNet to exclude significant arrhythmia. His wife states he is somewhat confused during these episodes. I wonder if hypoxemia is contributing. I will ask pulmonary to evaluate for possible lung disease. He may need home oxygen.

## 2011-08-24 NOTE — Progress Notes (Signed)
   HPI: Pleasant male for FU CAD (bypass surgery in 1980). He later had PCI of the circumflex artery. He has hypertension and also has had postural hypotension. He also has chronic symptoms of dizziness. He was seen by Dr. Mikey Bussing who thought his dizziness was multifactorial and recommended that he could participate in the neurology rehabilitation program. Echocardiogram in June 2012 showed normal LV function, grade 2 diastolic dysfunction, mild aortic stenosis, mild mitral valve prolapse, mild MR and biatrial enlargement. Last Myoview in April 2013 showed an ejection fraction of 63% and no ischemia or infarction. Admitted in March with pneumonia. Patient seen for syncope in March of 2013 by Tereso Newcomer. The above Myoview was ordered. Cardionet was delayed because of difficulty managing at rehab. It was felt his syncope may be orthostatic mediated. Since he was seen previously he denies dyspnea, chest pain, palpitations or recurrent syncope. However he apparently is having episodes of hypoxia with ambulation with saturations in the high 70s and 80s. His heart rate is in the 50s. He becomes confused until he sits down.   Current Outpatient Prescriptions  Medication Sig Dispense Refill  . aspirin EC 325 MG tablet Take 325 mg by mouth daily.      [name] donepezil (ARICEPT) 10 MG tablet Take 10 mg by mouth daily.       [name] ezetimibe (ZETIA) 10 MG tablet Take 10 mg by mouth daily.      . Prenatal MV-Min-Fe Fum-FA-DHA (CENTRUM SPECIALIST PRENATAL PO) Take 1 capsule by mouth daily.      . simvastatin (ZOCOR) 40 MG tablet Take 40 mg by mouth every evening.         Past Medical History  Diagnosis Date  . Colonic polyp   . Diverticulosis of colon   . HLD (hyperlipidemia)   . HTN (hypertension)   . Primary pulmonary HTN   . Benign hypertensive heart disease without heart failure   . CAD (coronary artery disease)   . Pneumonia   . Cancer     hx of skin ca  . Arthritis   . DEMENTIA     Past Surgical  History  Procedure Date  . Coronary artery bypass graft 1980  . Laparoscopic cholecystectomy   . Ptca     History   Social History  . Marital Status: Married    Spouse Name: N/A    Number of Children: 2  . Years of Education: N/A   Occupational History  . retired    Social History Main Topics  . Smoking status: Former Games developer  . Smokeless tobacco: Not on file  . Alcohol Use: Yes     rarely  . Drug Use: No  . Sexually Active: No   Other Topics Concern  . Not on file   Social History Narrative  . No narrative on file    ROS: no fevers or chills, productive cough, hemoptysis, dysphasia, odynophagia, melena, hematochezia, dysuria, hematuria, rash, seizure activity, orthopnea, PND, pedal edema, claudication. Remaining systems are negative.  Physical Exam: Well-developed well-nourished in no acute distress.  Skin is warm and dry.  HEENT is normal.  Neck is supple. No thyromegaly.  Chest is clear to auscultation with normal expansion.  Cardiovascular exam is regular rate and rhythm. 2/6 systolic murmur left sternal border. 2/6 systolic murmur apex. Abdominal exam nontender or distended. No masses palpated. Extremities show no edema. Ecchymosis noted neuro grossly intact

## 2011-08-24 NOTE — Assessment & Plan Note (Signed)
Continue aspirin and statin. 

## 2011-08-27 ENCOUNTER — Telehealth: Payer: Self-pay

## 2011-08-27 NOTE — Telephone Encounter (Signed)
LAURA FROM [facility] STATES PT'S OXYGEN LEVEL IS 81% WITH AMBULATION AND 97 WHEN RESTING WOULD LIKE TO SPEAK WITH SOMEONE. PLEASE CALL [phone]

## 2011-08-28 NOTE — Telephone Encounter (Signed)
Spoke with Vernona Rieger, she wanted to give [place] the FYI on patient and his syncopal episodes.  Has seen Dr. Jens Som recently and has appt to see Pulmonary in 2 weeks.  Advised if he is having increased trouble, we can see him in the meantime.  Or they can contact Dr. Jens Som to see if he would like to go ahead and start O2.  She will relay to patient.  Chart reviewed with Chelle.

## 2011-08-28 NOTE — Telephone Encounter (Signed)
Agree 

## 2011-08-29 ENCOUNTER — Ambulatory Visit (INDEPENDENT_AMBULATORY_CARE_PROVIDER_SITE_OTHER): Payer: Medicare Other | Admitting: Emergency Medicine

## 2011-08-29 ENCOUNTER — Ambulatory Visit: Payer: Medicare Other

## 2011-08-29 VITALS — BP 174/92 | HR 53 | Temp 98.0°F | Resp 18

## 2011-08-29 DIAGNOSIS — J189 Pneumonia, unspecified organism: Secondary | ICD-10-CM

## 2011-08-29 NOTE — Progress Notes (Signed)
  Subjective:    Patient ID: Arthur Lane, male    DOB: [birth year], 78 y.o.   MRN: [id number]  HPI patient presents with a rather complicated history. He was admitted last month with pneumonia and  had  trouble with near syncopal episodes associated with hypoxia. he has had close followup with the cardiologist. Apparently when he is asked to walk he has drops in his oxygen saturation into the 80s. He spent some time at [facility] with rehabilitation and had some near syncopal episodes there also. He has been to see Dr. Jens Som his cardiologist and has undergone a Holter monitor. The results are still pending. He initially day for a chest x-ray ordered by Dr. Jens Som. He is scheduled to see the pulmonologist next month.    Review of Systems     Objective:   Physical Exam patient is alert and cooperative. His neck is supple. His chest examination reveals clear lung sounds. Cardiac exam reveals frequent skipped beats. Abdomen is soft his extremities are cool and somewhat cyanotic however pulse ox readings were between 94 and 96.   [facility] reading (PRIMARY) by  Dr. Cleta Alberts chest x-ray shows no acute disease .      Assessment & Plan:  Patient status post recent hospitalization for hypoxia and pneumonia. He has a history of coronary artery disease. He has had a Holter monitor done results are pending. We called [facility] and they were kind enough to move up his appointment to tomorrow.

## 2011-08-30 ENCOUNTER — Encounter (HOSPITAL_COMMUNITY)
Admission: RE | Admit: 2011-08-30 | Discharge: 2011-08-30 | Disposition: A | Discharge status: Home / Self Care | Payer: Medicare Other | Source: Ambulatory Visit | Attending: Internal Medicine | Admitting: Internal Medicine

## 2011-08-30 ENCOUNTER — Encounter: Payer: Self-pay | Admitting: Internal Medicine

## 2011-08-30 ENCOUNTER — Ambulatory Visit (INDEPENDENT_AMBULATORY_CARE_PROVIDER_SITE_OTHER): Payer: Medicare Other | Admitting: Internal Medicine

## 2011-08-30 ENCOUNTER — Encounter (HOSPITAL_COMMUNITY): Payer: Self-pay

## 2011-08-30 ENCOUNTER — Encounter (HOSPITAL_COMMUNITY)
Admission: RE | Admit: 2011-08-30 | Discharge: 2011-08-30 | Disposition: A | Discharge status: Home / Self Care | Payer: Medicare Other | Source: Ambulatory Visit | Attending: Cardiology | Admitting: Cardiology

## 2011-08-30 VITALS — BP 122/60 | Temp 97.4°F | Resp 52 | Ht 68.0 in | Wt 141.8 lb

## 2011-08-30 DIAGNOSIS — R55 Syncope and collapse: Secondary | ICD-10-CM | POA: Insufficient documentation

## 2011-08-30 DIAGNOSIS — R0902 Hypoxemia: Secondary | ICD-10-CM | POA: Insufficient documentation

## 2011-08-30 DIAGNOSIS — I270 Primary pulmonary hypertension: Secondary | ICD-10-CM

## 2011-08-30 MED ORDER — XENON XE 133 GAS
15.0000 | GAS_FOR_INHALATION | Freq: Once | RESPIRATORY_TRACT | Status: AC | PRN
  Administered 2011-08-30: 15 via RESPIRATORY_TRACT

## 2011-08-30 MED ORDER — TECHNETIUM TO 99M ALBUMIN AGGREGATED
3.0000 | Freq: Once | INTRAVENOUS | Status: AC | PRN
  Administered 2011-08-30: 3 via INTRAVENOUS

## 2011-08-30 NOTE — Patient Instructions (Signed)
Please see patient coordinator before you leave today  to schedule v/q lung scan as soon as possible

## 2011-08-30 NOTE — Progress Notes (Signed)
Subjective:     Patient ID: Arthur Lane, male   DOB: [birth year]  MRN: [id number]  HPI  38 yowm quit smoking in the 1960s with no respiratory complaints referred to pulmonary clinic 08/30/2011  for unexplained drop in sats walking p admit for ? CAP by Dr Jens Som.   Admit date: 07/31/2011  Discharge date: 08/07/2011  PCP: Laurena Slimmer, MD, MD  Discharge Diagnosis:  1. Dementia  2. PNA  3. HTN  4. HLD  5. Thrombocytopenia.    08/30/2011 1st pulmonary eval cc light headed with walking room to room  x3 years, worse over the last month in that happens more frequently with presyncope. No assoc cough or cp. Pt not aware when his sats drop and there does not appear to be correlation between sats as low as 77 and his light headed sensation which may correlate more with his pulse.    He has significant dementia  and hearing loss so direct hx was difficult. Swallowing though ok and returned back home from [facility] w/in the last week  Sleeping ok without nocturnal  or early am exacerbation  of respiratory  c/o's or need for noct saba. Also denies any obvious fluctuation of symptoms with weather or environmental changes or other aggravating or alleviating factors except as outlined above   ROS  At present neg for  any significant sore throat, dysphagia, dental problems, itching, sneezing,  nasal congestion or excess/ purulent secretions, ear ache,   fever, chills, sweats, unintended wt loss, pleuritic or exertional cp, hemoptysis, palpitations, orthopnea pnd or leg swelling.  Also denies presyncope, palpitations, heartburn, abdominal pain, anorexia, nausea, vomiting, diarrhea  or change in bowel or urinary habits, change in stools or urine, dysuria,hematuria,  rash, arthralgias, visual complaints, headache, numbness  Or focal motor weakness/ amaurosis/ dysarthria or diplopia with spells.   No h/o rheumatologic or liver dz.           Review of Systems     Objective:   Physical Exam Very  frail but pleasant w/c bound wm nad HEENT: nl dentition, turbinates, and orophanx. Nl external ear canals without cough reflex   NECK :  without JVD/Nodes/TM/ nl carotid upstrokes bilaterally   LUNGS: no acc muscle use, clear to A and P bilaterally without cough on insp or exp maneuvers   CV:  RRR  no s3 or murmur or increase in P2, no edema   ABD:  soft and nontender with nl excursion in the supine position. No bruits or organomegaly, bowel sounds nl  MS:  warm without deformities, calf tenderness, cyanosis or clubbing  SKIN: warm and dry without lesions    NEURO:  alert,  No motor deficits  cxr 08/29/11 Negative exam.       Assessment:         Plan:

## 2011-08-30 NOTE — Assessment & Plan Note (Signed)
-   08/30/2011  Walked RA x 3 laps @ 185 ft each stopped due to  Sat 88% at end of study but no symptoms   - V/Q ordered 08/30/2011   We were not able to reproduce any symptoms here and most of the desat issue was resolved with use of an ear probe.  Neverthless with nl cxr and ? Of PAH on echo the next step is v/q lung scan, ordered.  Pulmonary f/u should not be needed here, however.  Overall impression is one of geriatric decline with very limited benefit likely from seeing multiple specialists at this point in life.

## 2011-08-30 NOTE — Assessment & Plan Note (Signed)
Strongly doubt primary pah but need to exclude TEPAH here > v/q ordered based on nl exam and cxr

## 2011-08-31 NOTE — Progress Notes (Signed)
Quick Note:  Spoke with pt and notified of results per Dr. Wert. Pt verbalized understanding and denied any questions.  ______ 

## 2011-09-03 ENCOUNTER — Telehealth: Payer: Self-pay

## 2011-09-03 NOTE — Telephone Encounter (Signed)
LMOM that pt was seen here for pneumonia once on 08/25/2011 but he now has a pulmonologist, Dr. Sandrea Hughs, she should consult him with pulmonology questions/concerns.

## 2011-09-03 NOTE — Telephone Encounter (Signed)
Arthur Lane WITH [facility] WOULD LIKE YOU TO KNOW THAT [facility] OXYGEN LEVELS AT REST ARE 83-85    BEST PHONE [phone]

## 2011-09-05 ENCOUNTER — Telehealth: Payer: Self-pay

## 2011-09-05 NOTE — Telephone Encounter (Signed)
Brooke calling to get answer on info about pt that was called in by [place] from [facility] please contact her [phone]

## 2011-09-05 NOTE — Telephone Encounter (Signed)
Elizabeth from [facility] (PT) just left [employer].  Says the pulmonologist has said everything looks okay, but the patient still is not doing well.  His wife is very concerned.  Lanora Manis thinks Dr Cleta Alberts should know this and also thinks someone should call patient's wife to discuss what should happen next.

## 2011-09-05 NOTE — Telephone Encounter (Signed)
Call  can tell her I would be happy to see her husband again tomorrow or she can followup with the cardiologist whichever is easier for her.

## 2011-09-05 NOTE — Telephone Encounter (Signed)
Can you please advise on this

## 2011-09-06 ENCOUNTER — Telehealth: Payer: Self-pay | Admitting: Cardiology

## 2011-09-06 NOTE — Telephone Encounter (Signed)
New problem:  Per Brook from [facility]. Seen patient on yesterday. sats drop to 81 % with ambulation . Came back up with rest. Wife question a sleep study.

## 2011-09-06 NOTE — Telephone Encounter (Signed)
LMOM for Brooke from [facility] to give her Dr Ellis Parents instr's for pt and told her I would also call pt. Spoke w/pt's wife and she stated that she will prob bring pt in to see Dr Cleta Alberts on Monday.

## 2011-09-06 NOTE — Telephone Encounter (Signed)
Left message for brooke, they need to contact dr wert's office regarding a sleep study.

## 2011-09-12 ENCOUNTER — Telehealth: Payer: Self-pay | Admitting: Internal Medicine

## 2011-09-12 NOTE — Telephone Encounter (Signed)
lmomtcb x1 for brooke

## 2011-09-13 ENCOUNTER — Ambulatory Visit: Payer: Medicare Other | Admitting: Cardiology

## 2011-09-13 ENCOUNTER — Encounter: Payer: Self-pay | Admitting: Cardiology

## 2011-09-13 ENCOUNTER — Telehealth: Payer: Self-pay

## 2011-09-13 NOTE — Telephone Encounter (Signed)
LMOM TCB x2 for Brooke.

## 2011-09-13 NOTE — Telephone Encounter (Signed)
.  [facility]  Elizabeth with [facility] called to request a verbal order for 2 times a week for two weeks home health physical therapy.  Lanora Manis also stated that Mr. Humble is not doing well and she feels he may need to be seen in the near future.  Please call Lanora Manis at [phone].

## 2011-09-14 ENCOUNTER — Telehealth: Payer: Self-pay

## 2011-09-14 NOTE — Telephone Encounter (Signed)
LMOM for [place] from [facility] giving VO for PT per Helmut Muster. Called and spoke w/pts wife and expressed our concern for pt and encouraged pt to RTC. Wife agreed to call and get Dr Ellis Parents schedule next week.

## 2011-09-14 NOTE — Telephone Encounter (Signed)
.  [facility] The RN from [facility] called to request verbal order for 2x per week for two weeks to treat patient's oxygen issues.  Please call RN at [phone].

## 2011-09-14 NOTE — Telephone Encounter (Signed)
lmomtcb x3 for brooke

## 2011-09-14 NOTE — Telephone Encounter (Signed)
Ok to authorize PT.  Please get details about how he is and recommend he RTC.

## 2011-09-16 NOTE — Telephone Encounter (Signed)
Spoke to nurse. Gave verbal order.

## 2011-09-16 NOTE — Telephone Encounter (Signed)
OK for 2x/week

## 2011-09-17 NOTE — Telephone Encounter (Signed)
LMOMTCB x 1 for Brooke.  Also called to speak with the pt to see if this is something he is interested in. Per spouse, pt unavailable and she will have him call back regarding this issue.

## 2011-09-18 NOTE — Telephone Encounter (Signed)
I spoke with spouse and she states they have not decided on to have a sleep study and will call if they decide to do so and we can sign off this message

## 2011-09-19 ENCOUNTER — Telehealth: Payer: Self-pay | Admitting: Cardiology

## 2011-09-19 NOTE — Telephone Encounter (Signed)
Spoke with home health, aware pt has aqn appt next week with dr Jens Som. Will cont to monitor until then.

## 2011-09-19 NOTE — Telephone Encounter (Signed)
New msg [facility] called Nurse said he has been confused when he moves around. His oxygen drops and BP drops Please call back to discuss

## 2011-09-19 NOTE — Telephone Encounter (Signed)
Schedule fuov Arthur Lane  

## 2011-09-19 NOTE — Telephone Encounter (Signed)
Spoke with home health, the pt today walked for one minute and his 02 sat dropped to 85%. She states the more he walks the more he seem to get confused, ie can't find the chair, walks into the coffee table. She reports his bp 100/70 when he stands. She wanted to know if we could do some form of treadmill testing to see what his heart and 02% are doing with exercise. Sometimes these symptoms occur after walking 1 min or at times it is not until 3 min of walking that he gets confused. Will forward for dr Jens Som review

## 2011-09-20 ENCOUNTER — Telehealth: Payer: Self-pay

## 2011-09-20 ENCOUNTER — Institutional Professional Consult (permissible substitution): Payer: Medicare Other | Admitting: Emergency Medicine

## 2011-09-20 NOTE — Telephone Encounter (Signed)
Lanora Manis, the PT doing home care therapy for pt called again to report that pt is still not doing well. She has also let Dr Jens Som know and he just wants to see pt at scheduled visit next Mon or Tues. Reports that pt will be walking just fine and then all of the sudden pt will start walking very imbalanced and crumble and/or walk into things, and he becomes very confused at the same time. Pt reports no dizziness. When pt goes into these confused episodes he will also try to sit down where there is not chair. This week pt's PO2 did drop to 84-85% and his B/P also dropped (but not low enough to cause the confusion/stumbling) but last week this was happening and pts PO2 remained in the 90s and B/P was high. Elizabeht encouraged wife to bring pt in to see Dr Cleta Alberts, but wife says she doesn't know what Dr Cleta Alberts can do, and it is hard to get pt in to clinic. Lanora Manis wanted Dr Cleta Alberts to know what pt's Sxs are bc they seem to be medical, not r/t his phys therapy.

## 2011-09-21 NOTE — Telephone Encounter (Signed)
Dr.Daub: Arthur Lane from [facility] states that pts original ot order will be ending soon and would like to know if you could give a verbal order for pt to continue having ot. Arthur Lane: [phone] she states that you can leave a message with the order if she is unable to answer.

## 2011-09-25 ENCOUNTER — Ambulatory Visit (INDEPENDENT_AMBULATORY_CARE_PROVIDER_SITE_OTHER): Payer: Medicare Other | Admitting: Cardiology

## 2011-09-25 ENCOUNTER — Encounter: Payer: Self-pay | Admitting: Cardiology

## 2011-09-25 VITALS — BP 148/76 | HR 56 | Ht 68.0 in | Wt 142.0 lb

## 2011-09-25 DIAGNOSIS — I2510 Atherosclerotic heart disease of native coronary artery without angina pectoris: Secondary | ICD-10-CM

## 2011-09-25 DIAGNOSIS — E785 Hyperlipidemia, unspecified: Secondary | ICD-10-CM

## 2011-09-25 DIAGNOSIS — R55 Syncope and collapse: Secondary | ICD-10-CM

## 2011-09-25 DIAGNOSIS — I10 Essential (primary) hypertension: Secondary | ICD-10-CM

## 2011-09-25 NOTE — Assessment & Plan Note (Signed)
Continue aspirin and statin. 

## 2011-09-25 NOTE — Assessment & Plan Note (Addendum)
Etiology of his event remains unclear to me. Apparently he has some feelings of needing to stop after he begins ambulating. He finds this difficult to describe. He denies dyspnea, chest pain or palpitations. His oxygen saturations will fall and apparently he becomes confused. All of his symptoms resolve with sitting down. His echocardiogram showed normal LV function. His Myoview showed no ischemia. His monitor showed sinus rhythm to sinus bradycardia with PACs and PVCs. A VQ scan was low probability. We ambulated the patient today in the office and his sats declined to the 70s. I will begin home oxygen to see if this improves his symptoms. I will review with pulmonary and some of my partners. I am not clear as to why he is having the above symptoms. Hopefully oxygen will improve. >30 min spent with patient and wife in office today.

## 2011-09-25 NOTE — Assessment & Plan Note (Signed)
Continue statin. 

## 2011-09-25 NOTE — Assessment & Plan Note (Signed)
Will not add additional medications given history of orthostasis.

## 2011-09-25 NOTE — Progress Notes (Signed)
HPI: Pleasant male for FU CAD (bypass surgery in 1980). He later had PCI of the circumflex artery. He has hypertension and also has had postural hypotension. He also has chronic symptoms of dizziness. He was seen by Dr. Mikey Bussing who thought his dizziness was multifactorial and recommended that he could participate in the neurology rehabilitation program. Echocardiogram in June 2012 showed normal LV function, grade 2 diastolic dysfunction, mild aortic stenosis, mild mitral valve prolapse, mild MR and biatrial enlargement. Last Myoview in April 2013 showed an ejection fraction of 63% and no ischemia or infarction. Admitted in March with pneumonia. Patient seen for syncope in March of 2013 by Tereso Newcomer. The above Myoview was ordered. Cardionet was delayed because of difficulty managing at rehab but revealed bradycardia, sinus with pacs and pvcs. It was felt his syncope may be orthostatic mediated. When I last saw him in April of 2013 he complained of having episodes of hypoxia with ambulation with saturations in the high 70s and 80s. His heart rate is in the 50s. He becomes confused until he sits down. Patient was seen by pulmonary and VQ scan was low probability. Since he was last seen, there is no dyspnea, chest pain or syncope. He still has episodes when he begins walking when he feels the need to stop and apparently his saturations dropped and he feels confused. This improves over time.   Current Outpatient Prescriptions  Medication Sig Dispense Refill  . aspirin EC 325 MG tablet Take 325 mg by mouth daily.      [name] donepezil (ARICEPT) 10 MG tablet Take 10 mg by mouth daily.       [name] ezetimibe (ZETIA) 10 MG tablet Take 10 mg by mouth daily.      . Prenatal MV-Min-Fe Fum-FA-DHA (CENTRUM SPECIALIST PRENATAL PO) Take 1 capsule by mouth daily.      . simvastatin (ZOCOR) 40 MG tablet Take 40 mg by mouth every evening.         Past Medical History  Diagnosis Date  . Colonic polyp   . Diverticulosis  of colon   . HLD (hyperlipidemia)   . HTN (hypertension)   . Primary pulmonary HTN   . Benign hypertensive heart disease without heart failure   . CAD (coronary artery disease)   . Pneumonia   . Cancer     hx of skin ca  . Arthritis   . DEMENTIA     Past Surgical History  Procedure Date  . Coronary artery bypass graft 1980  . Laparoscopic cholecystectomy   . Ptca     History   Social History  . Marital Status: Married    Spouse Name: N/A    Number of Children: 2  . Years of Education: N/A   Occupational History  . retired    Social History Main Topics  . Smoking status: Former Smoker    Quit date: 05/15/1976  . Smokeless tobacco: Not on file  . Alcohol Use: Yes     rarely  . Drug Use: No  . Sexually Active: No   Other Topics Concern  . Not on file   Social History Narrative  . No narrative on file    ROS: no fevers or chills, productive cough, hemoptysis, dysphasia, odynophagia, melena, hematochezia, dysuria, hematuria, rash, seizure activity, orthopnea, PND, pedal edema, claudication. Remaining systems are negative.  Physical Exam: Well-developed frail in no acute distress.  Skin is warm and dry.  HEENT is normal.  Neck is supple.  Chest is  clear to auscultation with normal expansion.  Cardiovascular exam is regular rate and rhythm.  Abdominal exam nontender or distended. No masses palpated. Extremities show no edema. neuro grossly intact

## 2011-09-25 NOTE — Patient Instructions (Signed)
Your physician recommends that you schedule a follow-up appointment in: 8 WEEKS   

## 2011-09-26 ENCOUNTER — Telehealth: Payer: Self-pay | Admitting: *Deleted

## 2011-09-26 NOTE — Telephone Encounter (Signed)
Spoke with Efraim Kaufmann at home health, she is going to coordinate with brooke about getting the 02 sat requirements for medicare and home 02.

## 2011-09-26 NOTE — Telephone Encounter (Signed)
Left message for brooke to call regarding pt and oxygen orders

## 2011-09-27 ENCOUNTER — Telehealth: Payer: Self-pay | Admitting: Cardiology

## 2011-09-27 NOTE — Telephone Encounter (Signed)
New problem:  Per Melissa calling from [facility]. Update  Home testing for oxygen. Will bring order by for MD to sign.

## 2011-09-27 NOTE — Telephone Encounter (Signed)
After D/W Dr Cleta Alberts, [facility] at [place]' cell # to advise her to call Dr Jens Som for OT orders bc he has just recently eval pt and put him on O2 and will need to give orders as to what OT can do w/pt.

## 2011-09-27 NOTE — Telephone Encounter (Signed)
Spoke with Efraim Kaufmann, she is bringing over an order for home testing of the pts 02 sat to tryu to qualify with medicare.

## 2011-09-27 NOTE — Telephone Encounter (Signed)
Spoke with Arthur Lane, we are trying to get the pt qualified for oxygen at home. Okay given for one more week.

## 2011-09-27 NOTE — Telephone Encounter (Signed)
New msg Beth with [facility] [place] to get order for pt for one more week

## 2011-10-01 ENCOUNTER — Telehealth: Payer: Self-pay | Admitting: *Deleted

## 2011-10-01 ENCOUNTER — Telehealth: Payer: Self-pay

## 2011-10-01 NOTE — Telephone Encounter (Signed)
Arthur Lane and LM on RN VM leaving some new info about pt's Sxs. She stated that she had pieced together after conversation w/pt and wife that many of pt's problems have occurred after eating. Several times pt has had no trouble walking into a restaurant and eating but then has trouble getting out of the restaurant. She wanted [place] to know in case it might point to post-prandial hypotension. Called Dr Ludwig Clarks office and spoke w/his nurse to give her this info in case it might help in Dxing pt. Dr Cleta Alberts, I'm forwarding this FYI. Also wanted you to know that Dr Jens Som is working on trying to get the pt his O2.

## 2011-10-01 NOTE — Telephone Encounter (Signed)
Received a call from [place] at home health, she wanted to let us know when they went to the home to test the pt for the oxygen they were told the family was using a different agency for his oxygen. Also received a call from dr daub's office, the pt had called their office to let them know that these episodes the pt is having is related to eating. She reports the pt can go into a restaurant fine and then after he eats he gets confused. The wife was concerned he may have post prandial hypotension. Dr Cleta Alberts is out and wanted [place] to know because we have seen the pt recently. Will forward for dr Jens Som review

## 2011-10-03 LAB — PULMONARY FUNCTION TEST

## 2011-10-12 ENCOUNTER — Telehealth: Payer: Self-pay | Admitting: Cardiology

## 2011-10-12 ENCOUNTER — Telehealth: Payer: Self-pay | Admitting: *Deleted

## 2011-10-12 NOTE — Telephone Encounter (Signed)
Please return call to [facility]  [phone] concerning oxygen order.

## 2011-10-12 NOTE — Telephone Encounter (Signed)
Received fax with 02 sat testing. Order faxed to [phone] for 02 @ 2 liters per dr Jens Som

## 2011-10-12 NOTE — Telephone Encounter (Signed)
Spoke with Efraim Kaufmann, questions regarding oxygen answered.

## 2011-10-17 ENCOUNTER — Telehealth: Payer: Self-pay | Admitting: Cardiology

## 2011-10-17 NOTE — Telephone Encounter (Signed)
New msg [facility] called for an order to see pt one more time after getting oxygen

## 2011-10-17 NOTE — Telephone Encounter (Signed)
Beth from [facility]  [place] that pt  was put on oxygen recently. She  wants to have one more home visit to be aprobed,  To see   how pt is doing  with   the  oxygen , and check his 02 saturation. Visit was aprobed, Beth aware.

## 2011-10-26 ENCOUNTER — Other Ambulatory Visit: Payer: Self-pay | Admitting: Cardiology

## 2011-11-04 ENCOUNTER — Other Ambulatory Visit: Payer: Self-pay | Admitting: Cardiology

## 2011-11-22 ENCOUNTER — Ambulatory Visit: Payer: Medicare Other | Admitting: Cardiology

## 2011-12-14 ENCOUNTER — Encounter: Payer: Self-pay | Admitting: Emergency Medicine

## 2012-03-28 IMAGING — CT CT HEAD W/O CM
2 series · 16 of 30 positions shown, 18 images · non-contrast
Comparison: none

[Series 2: head w/o · axial · non-contrast · 0.49mm/px · z∈[+46,+159]mm · 8 of 28 slices shown, 10 images]
[im 4/28  brain]
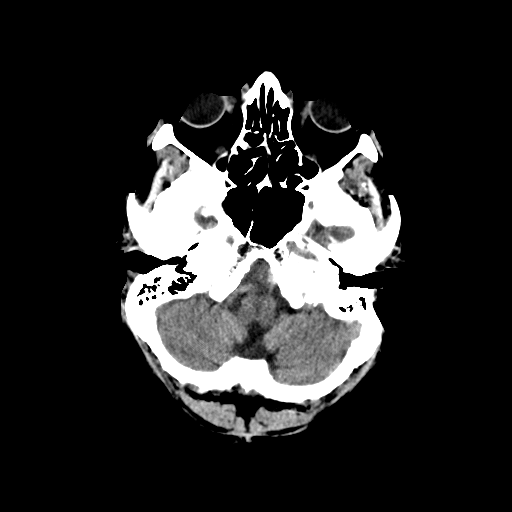
[im 4/28  bone]
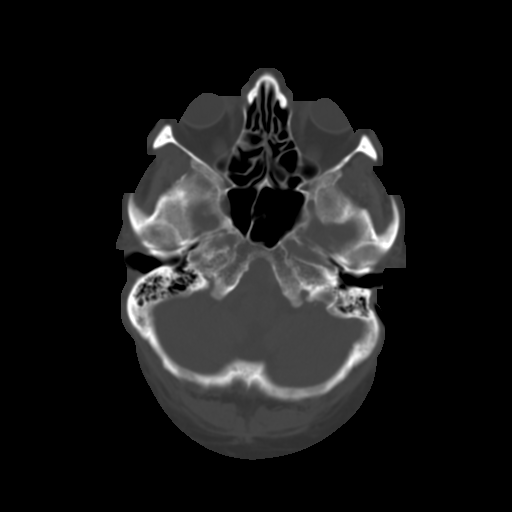
[im 7/28  brain]
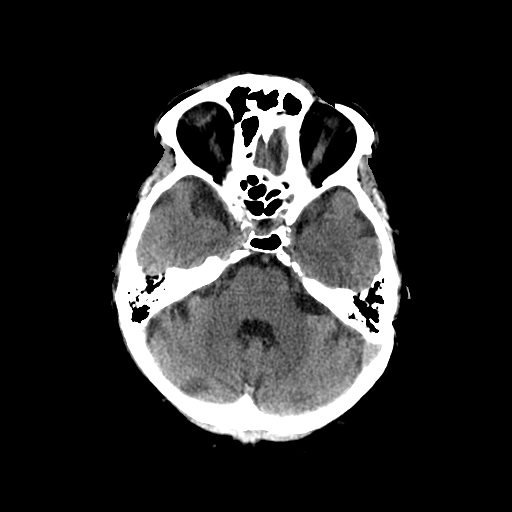
[im 10/28  brain]
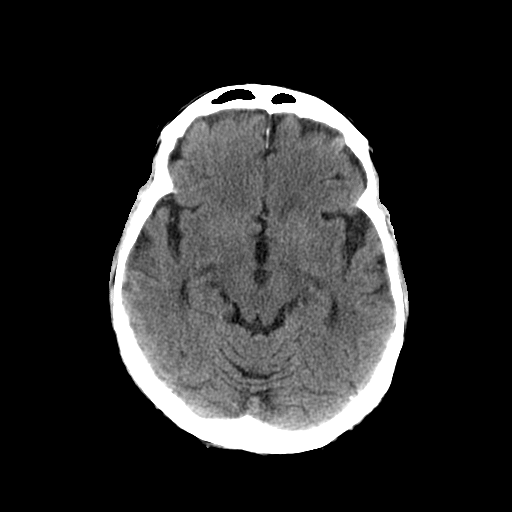
[im 13/28  brain]
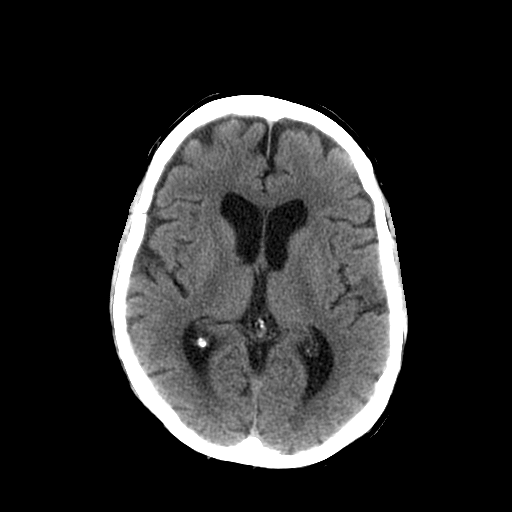
[im 16/28  brain]
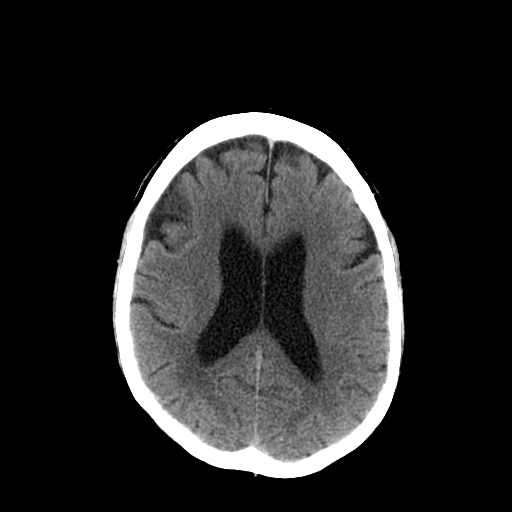
[im 16/28  bone]
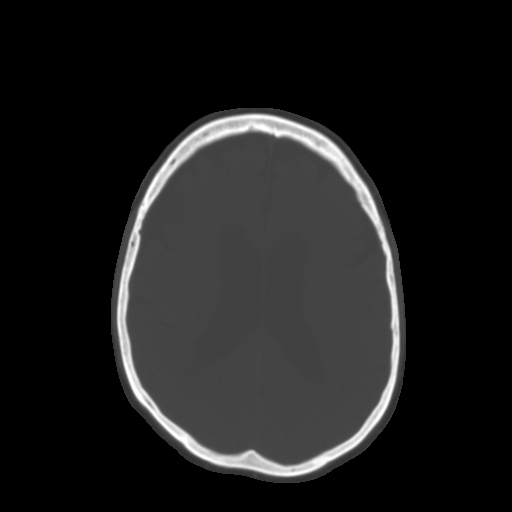
[im 19/28  brain]
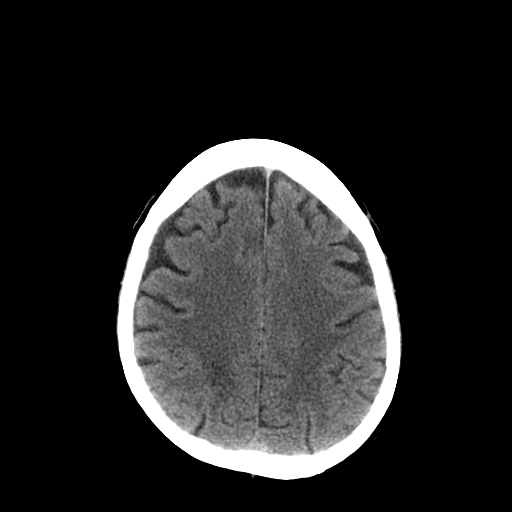
[im 22/28  brain]
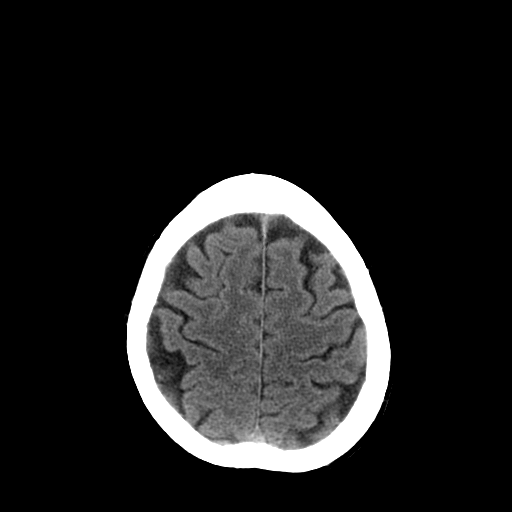
[im 25/28  brain]
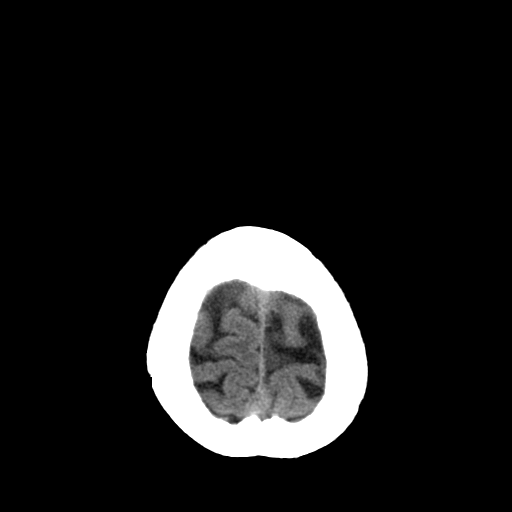

[Series 3: head bone · axial · 0.49mm/px · z∈[+42,+160]mm · 8 of 56 slices shown]
[im 6/56  bone]
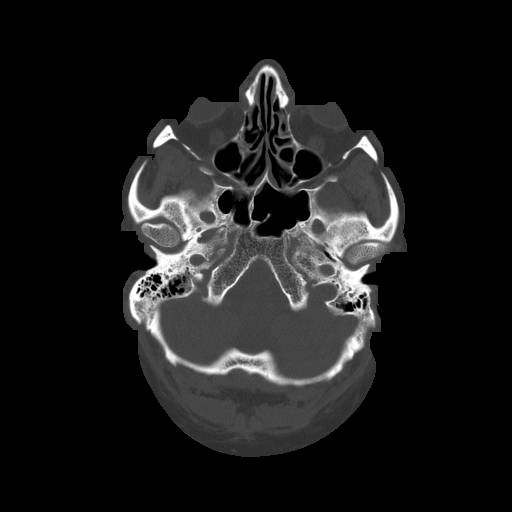
[im 12/56  bone]
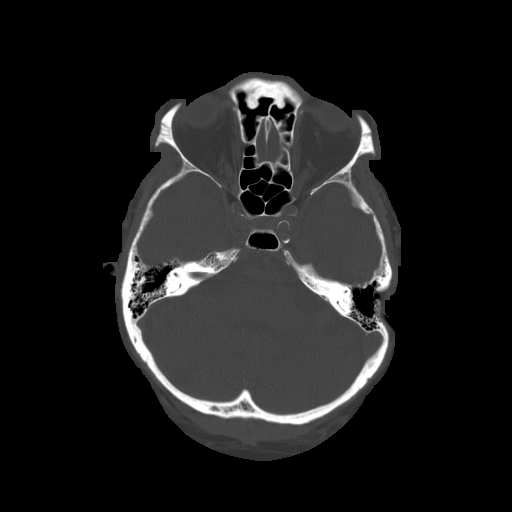
[im 18/56  bone]
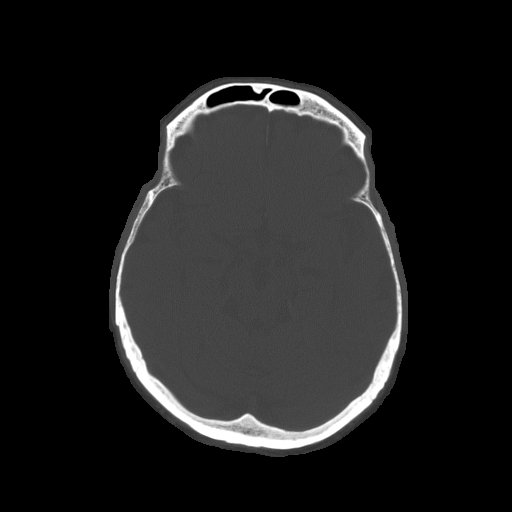
[im 24/56  bone]
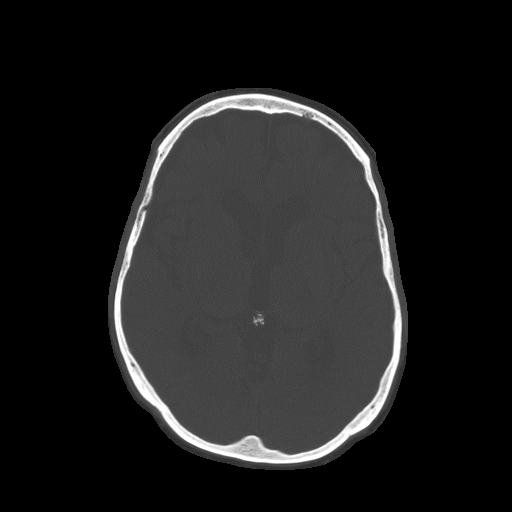
[im 32/56  bone]
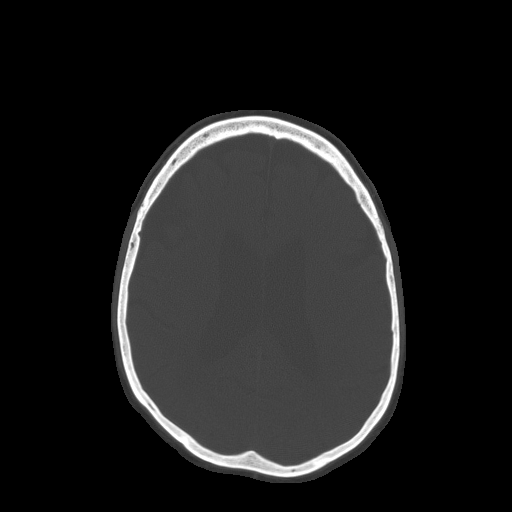
[im 38/56  bone]
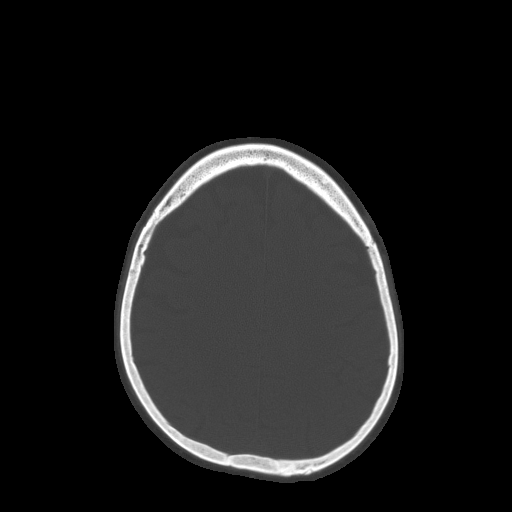
[im 44/56  bone]
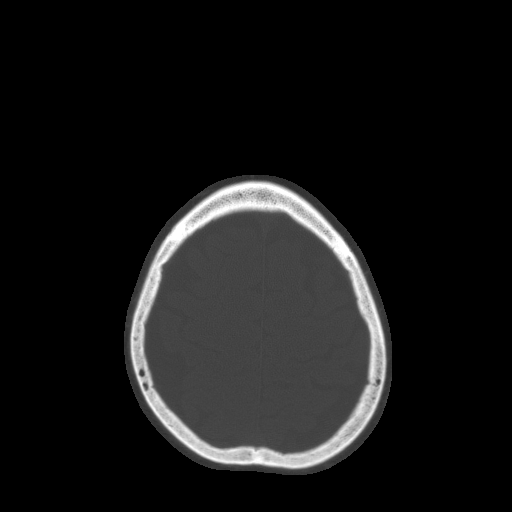
[im 50/56  bone]
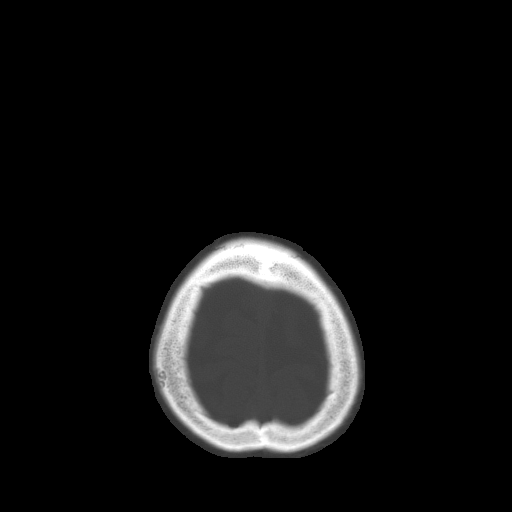

[16 of 30 positions shown; findings below may reference images not displayed]

This examination was performed at [HOSPITAL] at [HOSPITAL]. The interpretation will be provided by [REDACTED]

## 2012-07-07 ENCOUNTER — Telehealth: Payer: Self-pay

## 2012-07-07 ENCOUNTER — Telehealth: Payer: Self-pay | Admitting: Cardiology

## 2012-07-07 NOTE — Telephone Encounter (Signed)
Pt is requesting an order be placed with [facility] for patient to receive the smaller oxygen tank   [phone]

## 2012-07-07 NOTE — Telephone Encounter (Signed)
Oxygen not ordered through Dr Cleta Alberts. Called him to advise looks like this may be through the cardiologist. I have advised his wife to call Dr Jens Som to do this. Dr Jens Som may be upset if we change his orders/

## 2012-07-07 NOTE — Telephone Encounter (Signed)
Pt's wife calling re getting an order faxed to get a smaller oxygen tank at  fax [phone] [facility]

## 2012-07-07 NOTE — Telephone Encounter (Signed)
Pt's wife called, because she said pt is on oxygen and he needs to be tested for a small tank, And  [facility] needs a new prescription from Dr. Jens Som  To have that done . Prescription needs to be faxed to [phone].

## 2012-07-08 NOTE — Telephone Encounter (Signed)
Left message for home health to call , questions regarding what is needed.

## 2012-07-10 NOTE — Telephone Encounter (Signed)
Spoke with home health, order faxed to [phone] for portable evaluation for smaller oxygen tanks. Pt wife made aware.

## 2012-07-22 ENCOUNTER — Ambulatory Visit (INDEPENDENT_AMBULATORY_CARE_PROVIDER_SITE_OTHER): Payer: Medicare Other | Admitting: Emergency Medicine

## 2012-07-22 ENCOUNTER — Encounter: Payer: Self-pay | Admitting: Emergency Medicine

## 2012-07-22 VITALS — BP 117/73 | HR 57 | Temp 96.3°F | Resp 18 | Ht 68.0 in | Wt 142.0 lb

## 2012-07-22 DIAGNOSIS — R5383 Other fatigue: Secondary | ICD-10-CM

## 2012-07-22 DIAGNOSIS — R531 Weakness: Secondary | ICD-10-CM

## 2012-07-22 DIAGNOSIS — R269 Unspecified abnormalities of gait and mobility: Secondary | ICD-10-CM

## 2012-07-22 DIAGNOSIS — R350 Frequency of micturition: Secondary | ICD-10-CM

## 2012-07-22 LAB — CBC
HCT: 41 % (ref 39.0–52.0)
MCH: 31.1 pg (ref 26.0–34.0)
MCV: 93.2 fL (ref 78.0–100.0)
RBC: 4.4 MIL/uL (ref 4.22–5.81)
WBC: 6.7 10*3/uL (ref 4.0–10.5)

## 2012-07-22 LAB — POCT URINALYSIS DIPSTICK
Blood, UA: NEGATIVE
Spec Grav, UA: >=1.03

## 2012-07-22 LAB — POCT UA - MICROSCOPIC ONLY
Bacteria, U Microscopic: NEGATIVE
Crystals, Ur, HPF, POC: NEGATIVE

## 2012-07-22 NOTE — Progress Notes (Signed)
  Subjective:    Patient ID: Arthur Lane, male    DOB: [birth year], 79 y.o.   MRN: [id number]  HPI patient in for recheck. Over the last week maybe up to a month he has had unstable gait. When he stands up he feels like his legs are going to give way and he is going to fall. He has a history of syncope in the past. he definitely has a history of postural hypotension.  [name] He is not currently on any blood pressure medications. He was recently started on new medication to help with urinary urgency frequency but this is more likely the cause elevated blood pressure.    Review of Systems     Objective:   Physical Exam patient is alert and conversive. He is in no distress. His chest is clear his cardiac exam reveals a 2-3/6 systolic murmur at the upper left sternal border. Extremities without edema. I had to hold on to the back of his hands as we get him up and he shuffled with his gait and seemed to have an bilateral symmetrical weakness.        Assessment & Plan:  Patient with known Alzheimer's disease as well as cardiac disease history of hypoxia on O2 enters with difficulty with ambulation and gait. He has been on physical therapy in the past. He is seeing Dr. Cherlynn June in the past regarding his dementia. Routine labs were done today and I have ordered physical therapy to help get him back on track with strengthening exercises of the lower extremities.

## 2012-07-22 NOTE — Patient Instructions (Addendum)
You should be drinking at the MOST, 8 - 8oz. Cups of fluid a day (water, juice, tea, vitamin water, etc.)

## 2012-07-23 LAB — COMPREHENSIVE METABOLIC PANEL WITH GFR
ALT: 28 U/L (ref 0–53)
AST: 29 U/L (ref 0–37)
Albumin: 4.4 g/dL (ref 3.5–5.2)
Alkaline Phosphatase: 77 U/L (ref 39–117)
BUN: 32 mg/dL — ABNORMAL HIGH (ref 6–23)
CO2: 30 meq/L (ref 19–32)
Calcium: 9.3 mg/dL (ref 8.4–10.5)
Chloride: 108 meq/L (ref 96–112)
Creat: 1.31 mg/dL (ref 0.50–1.35)
Glucose, Bld: 82 mg/dL (ref 70–99)
Potassium: 4.5 meq/L (ref 3.5–5.3)
Sodium: 144 meq/L (ref 135–145)
Total Bilirubin: 1.2 mg/dL (ref 0.3–1.2)
Total Protein: 6.6 g/dL (ref 6.0–8.3)

## 2012-07-23 LAB — SEDIMENTATION RATE: Sed Rate: 1 mm/hr (ref 0–16)

## 2012-07-23 LAB — CK: Total CK: 43 U/L (ref 7–232)

## 2012-08-03 ENCOUNTER — Other Ambulatory Visit: Payer: Self-pay | Admitting: Neurology

## 2012-08-11 ENCOUNTER — Telehealth: Payer: Self-pay | Admitting: Neurology

## 2012-08-11 NOTE — Telephone Encounter (Signed)
Libby with [facility] PT called and pt concerned he has Parkinsons and fall risk, asking for appt.  I called and LMVM for Almyra Free to return call.

## 2012-08-11 NOTE — Telephone Encounter (Signed)
Pts note also sent to Dr. Vickey Huger, due to CM out of office.

## 2012-08-12 NOTE — Telephone Encounter (Signed)
Patient should be seen within the next 4 weeks , he is  Progressively impared and has gait difficulties with a high fall risk for several years.  If CM is booked , perhaps this patient can be seen in conjunction with the new NP?

## 2012-08-12 NOTE — Telephone Encounter (Signed)
Libby called from [facility] PT and noted that pt has had gait deviations and is increased risk for falls.  Sent a fax relating to this yesterday.  Recommends [place] visit for pt.  Call wife with appt.

## 2012-08-13 ENCOUNTER — Telehealth: Payer: Self-pay | Admitting: *Deleted

## 2012-08-13 NOTE — Telephone Encounter (Signed)
Still unsure if Mr B got an appointent with NP or me.... Please document your  Call . CD

## 2012-08-13 NOTE — Telephone Encounter (Signed)
Called patient and spoke with wife, requesting an appt next week, wed, or Friday,or to be placed on cancellation list.

## 2012-08-14 NOTE — Telephone Encounter (Signed)
Called patient and sched per Dr D for 08/19/12, confirmed appt

## 2012-08-18 NOTE — Telephone Encounter (Signed)
Patient has been sched.

## 2012-08-19 ENCOUNTER — Encounter: Payer: Self-pay | Admitting: Neurology

## 2012-08-19 ENCOUNTER — Other Ambulatory Visit: Payer: Medicare Other | Admitting: Neurology

## 2012-08-19 ENCOUNTER — Ambulatory Visit (INDEPENDENT_AMBULATORY_CARE_PROVIDER_SITE_OTHER): Payer: Medicare Other | Admitting: Neurology

## 2012-08-19 VITALS — BP 109/71 | HR 50 | Temp 96.1°F | Ht 66.0 in | Wt 141.0 lb

## 2012-08-19 DIAGNOSIS — G20 Parkinson's disease: Secondary | ICD-10-CM | POA: Insufficient documentation

## 2012-08-19 DIAGNOSIS — R269 Unspecified abnormalities of gait and mobility: Secondary | ICD-10-CM | POA: Insufficient documentation

## 2012-08-19 DIAGNOSIS — R413 Other amnesia: Secondary | ICD-10-CM | POA: Insufficient documentation

## 2012-08-19 MED ORDER — CARBIDOPA-LEVODOPA 25-100 MG PO TABS: ORAL_TABLET | ORAL | Status: DC

## 2012-08-19 MED ORDER — NAMENDA TITRATION PAK 28 X 5 MG & 21 X 10 MG PO TABS: ORAL_TABLET | ORAL | Status: DC

## 2012-08-19 NOTE — Progress Notes (Signed)
[facility]  Provider:  Dr Jacey Eckerson Referring Provider: Collene Gobble, MD Primary Care Physician:  Lucilla Edin, MD    HPI:  Arthur Lane is a 79 y.o. male here as a referral from Dr. Cleta Alberts for follow up of progressive decline in gait  and memory. [name]. is a 79 year old Caucasian male, right-handed, married, and  presented with lightheadedness and dizziness in the past,  but also has  a progressive gait disorder and her some progressive memory problems.  The patient has been on multiple antihypertensive medications in the past and modification of his HTN regimen had helped him to have less presyncopal spells. It was presumed that he has ortho-static hypotension but he also has a hoarseness of the voice and buckling knee, shuffling gait .  An  EEG in the past about 3 years ago was slow . Transcranial Doppler studies were showing left vertebral artery stenosis in the past . According to his wife the problem with  equilibrium has been more evident and when [name] has had sat for a while and gets up from a seated position on it that seem to have been worse when he was sitting in a warm place.  He has no pain and does not experience any true dizziness as of  the last 12 month .  But he has a high risk of falling.  It is his hearing has significantly declined.  He also endorsed the were as sleepiness scale of 12 points, has micro graphia and resting tremor, with pill -rolling  character  he scored 19/30 points on a Mini-Mental status evaluation on which he scored 23 point in 2010 and 17 in 2011. [name]  The wife hoticed that he been napping a lot  in the afternoon and may wake up and comments  on something he must have been  His animal fluency test showed only 7 animals today which is less than it was in the previous 2013 test , when  the animal fluency had been 11 names.   I would like to add that his reported loss of interest in   Reading and social activities.    Review of  Systems: Out of a complete 14 system review, the patient complains of only the following symptoms, and all other reviewed systems are negative.   History   Social History  . Marital Status: Married    Spouse Name: N/A    Number of Children: 2  . Years of Education: N/A   Occupational History  . retired    Social History Main Topics  . Smoking status: Former Smoker    Quit date: 05/15/1976  . Smokeless tobacco: Not on file  . Alcohol Use: No     Comment: rarely  . Drug Use: No  . Sexually Active: No   Other Topics Concern  . Not on file   Social History Narrative  . No narrative on file    Family History  Problem Relation Age of Onset  . Dementia Mother     died at 24    Past Medical History  Diagnosis Date  . Colonic polyp   . Diverticulosis of colon   . HLD (hyperlipidemia)   . HTN (hypertension)   . Primary pulmonary HTN   . Benign hypertensive heart disease without heart failure   . CAD (coronary artery disease)   . Pneumonia   . Cancer     hx of skin ca  . Arthritis   .  DEMENTIA   . Allergy   . Blood transfusion without reported diagnosis   . Cataract   . Syncope and collapse   . S/P biopsy     positive  prostatee biospy  . Progressive gait disorder     Past Surgical History  Procedure Laterality Date  . Coronary artery bypass graft  1980  . Laparoscopic cholecystectomy    . Ptca    . Eye surgery    . Coronary artery bypass graft      [place], had one cataract removed    Current Outpatient Prescriptions  Medication Sig Dispense Refill  . aspirin EC 325 MG tablet Take 325 mg by mouth daily.      [name] donepezil (ARICEPT) 10 MG tablet take 1 tablet by mouth once daily  90 tablet  0  . Prenatal MV-Min-Fe Fum-FA-DHA (CENTRUM SPECIALIST PRENATAL PO) Take 1 capsule by mouth daily.      . simvastatin (ZOCOR) 40 MG tablet take 1 tablet by mouth every evening  30 tablet  11  . ZETIA 10 MG tablet take 1 tablet by mouth once daily  30 tablet  11   . ezetimibe (ZETIA) 10 MG tablet Take 10 mg by mouth daily.      . simvastatin (ZOCOR) 40 MG tablet Take 40 mg by mouth every evening.       No current facility-administered medications for this visit.    Allergies as of 08/19/2012 - Review Complete 08/19/2012  Allergen Reaction Noted  . Pollen extract  08/19/2012    Vitals: BP 109/71  Pulse 50  Temp(Src) 96.1 F (35.6 C)  Ht 5\' 6"  (1.676 m)  Wt 141 lb (63.957 kg)  BMI 22.77 kg/m2 Last Weight:  Wt Readings from Last 1 Encounters:  08/19/12 141 lb (63.957 kg)   Last Height:   Ht Readings from Last 1 Encounters:  08/19/12 5\' 6"  (1.676 m)   Physical exam:  General: The patient is awake, alert and appears not in acute distress. The patient is well groomed. His masked face and resting tremor and posture is slightly bend forwards.  Head: Normocephalic, atraumatic. Neck is supple. Mallampati 2, neck circumference: Cardiovascular:  Regular rate and rhythm-  without  murmurs or carotid bruit, and without distended neck veins. Respiratory: Lungs are clear to auscultation. The patient provides minimal effort.  Skin:  Without evidence of edema, or rash Trunk: BMI is normal  and patient  has normal posture.   Neurologic exam : The patient is awake and alert, oriented to place and time.  Memory Impaired ,  there is a reduced  attention span & concentration ability.  Speech is sparse dysphonia or aphasia.  Hoarse voice , and microphonia - trailing off..  He has micrographia, too.  Mood and affect are appropriate.  Cranial nerves: Pupils are equal and briskly reactive to light. Funduscopic exam without  evidence of pallor or edema. Extraocular movements  in vertical and horizontal planes were restricted , he seemed to see less peripherally on the right and could'nt  Look up . Downward gaze is intact but he still walks bend over. (gaze without nystagmus).  Visual fields by finger perimetry are intact. Hearing : Significantly decreased   Facial sensation intact to fine touch.  Facial motor strength is symmetric and tongue and uvula move midline. Facial expression is limited in a  masked face.  Motor exam:  diffusely tone and normal muscle bulk - he still has symmetric normal strength in all extremities.  Sensory:  Fine touch, pinprick and vibration were tested in all extremities. Proprioception is tested in the upper extremities only.  Resting tremor.  Coordination: Rapid alternating movements in the fingers/hands were slowed - Finger-to-nose maneuver tested and slow ,  There is  evidence of dysmetria and  Resting  tremor.  Gait and station: Patient walks without assistive device  But is shuffling, his previously erect posture  Has vanished , he needed assistance to get to the exam table.    Strength within normal limits. Stance is stable and normal. Tandem gait is impaired ,he  turns with 7-8  Steps are unfragmented. Romberg testing is abnormal . Deep tendon reflexes: in the  upper and lower extremities are symmetric and intact. Babinski maneuver response is downgoing.   Assessment:  After physical and neurologic examination, review of laboratory studies, imaging, neurophysiology testing and pre-existing records, assessment will be reviewed on the problem list.  Plan:  Treatment plan and additional workup will be reviewed under Problem List.

## 2012-09-15 ENCOUNTER — Ambulatory Visit: Payer: Medicare Other | Admitting: Physical Therapy

## 2012-10-23 ENCOUNTER — Telehealth: Payer: Self-pay | Admitting: Neurology

## 2012-10-23 DIAGNOSIS — R269 Unspecified abnormalities of gait and mobility: Secondary | ICD-10-CM

## 2012-10-23 DIAGNOSIS — G20 Parkinson's disease: Secondary | ICD-10-CM

## 2012-10-23 DIAGNOSIS — R413 Other amnesia: Secondary | ICD-10-CM

## 2012-10-23 NOTE — Telephone Encounter (Signed)
The patient's wife called and said the patient takes sinemet tid, but the prescription was only for a quantity of 60.  They are running out of medicine and have no refills left.  She would like it to be fixed and needs a refill.

## 2012-10-24 ENCOUNTER — Other Ambulatory Visit: Payer: Self-pay | Admitting: Neurology

## 2012-10-24 DIAGNOSIS — G20 Parkinson's disease: Secondary | ICD-10-CM

## 2012-10-24 DIAGNOSIS — R269 Unspecified abnormalities of gait and mobility: Secondary | ICD-10-CM

## 2012-10-24 DIAGNOSIS — R413 Other amnesia: Secondary | ICD-10-CM

## 2012-10-24 MED ORDER — CARBIDOPA-LEVODOPA 25-100 MG PO TABS: ORAL_TABLET | ORAL | Status: DC

## 2012-10-24 NOTE — Telephone Encounter (Signed)
I refilled the patient's Sinemet medication for 90 days.

## 2012-11-04 ENCOUNTER — Ambulatory Visit: Payer: Self-pay | Admitting: Emergency Medicine

## 2012-11-04 ENCOUNTER — Ambulatory Visit: Payer: Medicare Other | Admitting: Emergency Medicine

## 2012-11-05 ENCOUNTER — Other Ambulatory Visit: Payer: Self-pay

## 2012-11-05 MED ORDER — DONEPEZIL HCL 10 MG PO TABS: 10.0000 mg | ORAL_TABLET | Freq: Every day | ORAL | Status: DC

## 2012-11-11 ENCOUNTER — Other Ambulatory Visit: Payer: Self-pay | Admitting: Emergency Medicine

## 2012-11-11 ENCOUNTER — Encounter: Payer: Self-pay | Admitting: Emergency Medicine

## 2012-11-11 ENCOUNTER — Ambulatory Visit (INDEPENDENT_AMBULATORY_CARE_PROVIDER_SITE_OTHER): Payer: Medicare Other | Admitting: Emergency Medicine

## 2012-11-11 VITALS — BP 140/82 | HR 52 | Temp 97.1°F | Resp 16 | Wt 140.0 lb

## 2012-11-11 DIAGNOSIS — I10 Essential (primary) hypertension: Secondary | ICD-10-CM

## 2012-11-11 DIAGNOSIS — G20 Parkinson's disease: Secondary | ICD-10-CM

## 2012-11-11 DIAGNOSIS — R5381 Other malaise: Secondary | ICD-10-CM

## 2012-11-11 DIAGNOSIS — G20C Parkinsonism, unspecified: Secondary | ICD-10-CM

## 2012-11-11 DIAGNOSIS — R531 Weakness: Secondary | ICD-10-CM

## 2012-11-11 DIAGNOSIS — M25569 Pain in unspecified knee: Secondary | ICD-10-CM

## 2012-11-11 LAB — CBC WITH DIFFERENTIAL/PLATELET
Basophils Relative: 0 % (ref 0–1)
Eosinophils Absolute: 0 10*3/uL (ref 0.0–0.7)
Hemoglobin: 13.4 g/dL (ref 13.0–17.0)
MCH: 31 pg (ref 26.0–34.0)
MCHC: 33.9 g/dL (ref 30.0–36.0)
Neutro Abs: 3.6 10*3/uL (ref 1.7–7.7)
Neutrophils Relative %: 65 % (ref 43–77)
Platelets: 108 10*3/uL — ABNORMAL LOW (ref 150–400)
RBC: 4.32 MIL/uL (ref 4.22–5.81)

## 2012-11-11 LAB — COMPREHENSIVE METABOLIC PANEL
ALT: <8 U/L (ref 0–53)
AST: 23 U/L (ref 0–37)
Albumin: 4.2 g/dL (ref 3.5–5.2)
Alkaline Phosphatase: 73 U/L (ref 39–117)
Potassium: 4.1 mEq/L (ref 3.5–5.3)
Sodium: 142 mEq/L (ref 135–145)
Total Bilirubin: 1.8 mg/dL — ABNORMAL HIGH (ref 0.3–1.2)
Total Protein: 6.3 g/dL (ref 6.0–8.3)

## 2012-11-11 LAB — MAGNESIUM: Magnesium: 2.2 mg/dL (ref 1.5–2.5)

## 2012-11-11 NOTE — Progress Notes (Signed)
  Subjective:    Patient ID: Arthur Lane, male    DOB: 03-16-1924, 79 y.o.   MRN: [id number]  HPI patient enters for followup. He has multiple medical problems. There was a time when with any type of exertion ambulation and standing he dropped his oxygen level. He has been under the care pulmonary and cardiology. His biggest complaint at the present time is that he has tightness in his quadriceps on both legs. He sees Dr. Vickey Huger  and is under treatment for parkinsonism. He will walk for short distances and then his legs were very pneumonia and to sit down. He alternates between using a walker and eating in a wheelchair. It is very difficult for him to come out of the house because of the risk of falling. His wife feels that she shook currently can handle all of his ADLs without assistance.    Review of Systems     Objective:   Physical Exam the patient does have a blank face . he does not have any true cogwheel rigidity. His chest is clear. Cardiac exam is due as a 3/6 harsh systolic murmur at the base of the heart and towards the apex. His rhythm appears regular. When I brought him up ambulating in the hall he made it approximately 30 feet and then we turned around he had to sit because his legs were giving out.         Assessment & Plan:     Patient with multiple medical problems. We'll check his metabolic screen including magnesium potassium and CPK MB sure these are okay. I suspect his episode of collapsing are more related to his general debilitation and parkinsonism on top of his dementia. We'll send physical therapy out to the house and see if they can work on strengthening his muscles and help with his gait disorder

## 2012-11-12 LAB — BILIRUBIN, FRACTIONATED(TOT/DIR/INDIR)
Bilirubin, Direct: 0.4 mg/dL — ABNORMAL HIGH (ref 0.0–0.3)
Indirect Bilirubin: 1.4 mg/dL — ABNORMAL HIGH (ref 0.0–0.9)
Total Bilirubin: 1.8 mg/dL — ABNORMAL HIGH (ref 0.3–1.2)

## 2012-11-18 ENCOUNTER — Other Ambulatory Visit: Payer: Self-pay | Admitting: Cardiology

## 2012-12-17 ENCOUNTER — Other Ambulatory Visit: Payer: Self-pay

## 2012-12-22 ENCOUNTER — Telehealth: Payer: Self-pay | Admitting: *Deleted

## 2012-12-22 NOTE — Telephone Encounter (Signed)
Per CM and LL called wife to move his appointment time up to 930 am to see LL. CM has never seen the patient and LL agreed to see the patient. The wife did not answer so I left a message on both numbers.

## 2012-12-22 NOTE — Telephone Encounter (Signed)
Spoke to wife and she will bring patient in to see LL at 915 am.

## 2012-12-23 ENCOUNTER — Ambulatory Visit (INDEPENDENT_AMBULATORY_CARE_PROVIDER_SITE_OTHER): Payer: Medicare Other | Admitting: Nurse Practitioner

## 2012-12-23 ENCOUNTER — Encounter: Payer: Self-pay | Admitting: Nurse Practitioner

## 2012-12-23 VITALS — BP 157/84 | HR 58 | Ht 66.0 in | Wt 142.0 lb

## 2012-12-23 DIAGNOSIS — R413 Other amnesia: Secondary | ICD-10-CM

## 2012-12-23 DIAGNOSIS — G20C Parkinsonism, unspecified: Secondary | ICD-10-CM

## 2012-12-23 DIAGNOSIS — G20A1 Parkinson's disease without dyskinesia, without mention of fluctuations: Secondary | ICD-10-CM

## 2012-12-23 DIAGNOSIS — G20 Parkinson's disease: Secondary | ICD-10-CM

## 2012-12-23 DIAGNOSIS — R269 Unspecified abnormalities of gait and mobility: Secondary | ICD-10-CM

## 2012-12-23 MED ORDER — CARBIDOPA-LEVODOPA 25-100 MG PO TABS: ORAL_TABLET | ORAL | Status: DC

## 2012-12-23 MED ORDER — DONEPEZIL HCL 10 MG PO TABS: 10.0000 mg | ORAL_TABLET | Freq: Every day | ORAL | Status: DC

## 2012-12-23 MED ORDER — MEMANTINE HCL ER 28 MG PO CP24: 1.0000 | ORAL_CAPSULE | Freq: Every day | ORAL | Status: DC

## 2012-12-23 NOTE — Patient Instructions (Addendum)
Start Namenda Titration pack according to package instructions.  When complete, switch to Namenda XR 28mg .  I am giving you a sample of 1 week, Take 1 daily.  I have sent a new prescription for Namenda XR 28mg  to [facility].   Increase Sinemet to 1.5 tablets three times a day.  New Rx sent to pharmacy.  I have ordered Home Health Physical Therapy.  Call our office if they don't contact you by the end of the week.  Follow up in 6 months.

## 2012-12-23 NOTE — Progress Notes (Signed)
[facility]  PATIENT: Arthur Lane DOB: 1923-07-07   HISTORY FROM: patient, wife, chart REASON FOR VISIT: follow up   HISTORICAL  CHIEF COMPLAINT:  Chief Complaint  Patient presents with  . Follow-up    RV#[id number]    HISTORY OF PRESENT ILLNESS: PRIOR HPI 08/19/12 (CD): Arthur Lane is a 79 y.o. male here as a referral from Dr. Cleta Lane for follow up of progressive decline in gait and memory. Mr. Arthur Lane. is a 79 year old Caucasian male, right-handed, married, and presented with lightheadedness and dizziness in the past, but also has a progressive gait disorder and her some progressive memory problems.  The patient has been on multiple antihypertensive medications in the past and modification of his HTN regimen had helped him to have less presyncopal spells. It was presumed that he has ortho-static hypotension but he also has a hoarseness of the voice and buckling knee, shuffling gait. An EEG in the past about 3 years ago was slow. Transcranial Doppler studies were showing left vertebral artery stenosis in the past.   According to his wife the problem with equilibrium has been more evident and when [name] has had sat for a while and gets up from a seated position on it that seem to have been worse when he was sitting in a warm place.  He has no pain and does not experience any true dizziness as of the last 12 month. But he has a high risk of falling.  It is his hearing has significantly declined. He also endorsed the were as sleepiness scale of 12 points, has micro graphia and resting tremor, with pill-rolling character he scored 19/30 points on a Mini-Mental status evaluation on which he scored  17 in 2011 and 23 point in 2010.  The wife hoticed that he been napping a lot in the afternoon and may wake up and comments on something he must have been dreaming.  His animal fluency test showed only 7 animals today which is less than it was in the previous 2013 test, when the animal  fluency had been 11 names.  I would like to add that his reported loss of interest in Reading and social activities.   UPDATE 12/23/12 (LL): Patient returns for 4 month follow up.  At last visit Namenda was added to Dementia treatment, already on Aricept 10 mg.  Patient's wife picked up titration pack (old formulation) from pharmacy but then received letter from [facility] saying that Namenda was discontinued, so she did not start the medication. Tolerating Sinemet 25-100 three times daily, admin at 7, 12, and 5.  Patient tolerating medication well, but reports that at times he feels "tightness" around his knees and weakness it his quadriceps.  He feels at times that he is frozen and cannot take another step.  Wife not sure if it correlates to when close to next Sinemet dose is due; thinks possibly it is.  Only mild problem getting up from seated.  Wife states they were contacted about going to outpatient PT at [facility], but it is too difficult for her to get [name] there; she is afraid he will fall.  He had one fall at home recently.  Would like home PT if possible.  Denies constipation, nausea, insomnia.  MMSE today 14, last visit was 19.  AFT today 6, last visit 7.  Clock drawing 1/4.   REVIEW OF SYSTEMS: Full 14 system review of systems performed and notable only for:  Constitutional: N/A  Cardiovascular: N/A  Ear/Nose/Throat: hearing loss  Skin: N/A  Eyes: N/A  Respiratory: N/A  Gastroitestinal: N/A  Genitourinary: Incontinence Hematology/Lymphatic: N/A  Endocrine: N/A Musculoskeletal:N/A  Allergy/Immunology: N/A  Neurological: N/A Psychiatric: N/A   ALLERGIES: Allergies  Allergen Reactions  . Pollen Extract     HOME MEDICATIONS: Outpatient Prescriptions Prior to Visit  Medication Sig Dispense Refill  . aspirin EC 325 MG tablet Take 325 mg by mouth daily.      [name] ezetimibe (ZETIA) 10 MG tablet Take 10 mg by mouth daily.      . Prenatal MV-Min-Fe Fum-FA-DHA (CENTRUM SPECIALIST PRENATAL  PO) Take 1 capsule by mouth daily.      . simvastatin (ZOCOR) 40 MG tablet Take 40 mg by mouth every evening.      [name] ZETIA 10 MG tablet take 1 tablet by mouth once daily  30 tablet  11  . carbidopa-levodopa (SINEMET IR) 25-100 MG per tablet Take 40 minutes before or after a meal. Take last dose at 6 PM not later.  Rec : intake times , 7 AM , 12 and 5 PM.  90 tablet  5  . donepezil (ARICEPT) 10 MG tablet Take 1 tablet (10 mg total) by mouth daily.  90 tablet  1  . NAMENDA TITRATION PAK tablet pack 5 mg/day for =1 week; 5 mg twice daily for =1 week; 15 mg/day given in 5 mg and 10 mg separated doses for =1 week; then 10 mg twice daily  49 tablet  12  . simvastatin (ZOCOR) 40 MG tablet take 1 tablet by mouth every evening  30 tablet  11   No facility-administered medications prior to visit.    PAST MEDICAL HISTORY: Past Medical History  Diagnosis Date  . Colonic polyp   . Diverticulosis of colon   . HLD (hyperlipidemia)   . HTN (hypertension)   . Primary pulmonary HTN   . Benign hypertensive heart disease without heart failure   . CAD (coronary artery disease)   . Pneumonia   . Cancer     hx of skin ca  . Arthritis   . DEMENTIA   . Allergy   . Blood transfusion without reported diagnosis   . Cataract   . Syncope and collapse   . S/P biopsy     positive  prostatee biospy  . Progressive gait disorder     PAST SURGICAL HISTORY: Past Surgical History  Procedure Laterality Date  . Coronary artery bypass graft  1980  . Laparoscopic cholecystectomy    . Ptca    . Eye surgery    . Coronary artery bypass graft      [place], [place] cataract removed    FAMILY HISTORY: Family History  Problem Relation Age of Onset  . Dementia Mother     died at 21    SOCIAL HISTORY: History   Social History  . Marital Status: Married    Spouse Name: N/A    Number of Children: 2  . Years of Education: N/A   Occupational History  . retired    Social History Main Topics  .  Smoking status: Former Smoker    Quit date: 05/15/1976  . Smokeless tobacco: Not on file  . Alcohol Use: No     Comment: rarely  . Drug Use: No  . Sexually Active: No   Other Topics Concern  . Not on file   Social History Narrative  . No narrative on file     PHYSICAL EXAM  Filed Vitals:  12/23/12 0923  BP: 157/84  Pulse: 58  Height: 5\' 6"  (1.676 m)  Weight: 142 lb (64.411 kg)   Body mass index is 22.93 kg/(m^2).  Physical exam:  General: The patient is awake, alert and appears not in acute distress. The patient is well groomed. His masked face and resting tremor and posture is slightly bend forwards.  Head: Normocephalic, atraumatic. Neck is supple. : Cardiovascular: Regular rate and rhythm, systolic 2/6 murmur or carotid bruit, and without distended neck veins.  Respiratory: Lungs are clear to auscultation. The patient provides minimal effort.  Skin: Without evidence of edema, or rash  Trunk: BMI is normal  Neurologic exam :  The patient is awake and alert, oriented to place and time. Memory Impaired , there is a reduced attention span & concentration ability.  Speech is sparse dysphonia or aphasia. Hoarse voice , and microphonia - trailing off..  He has micrographia, too.  Mood and affect are appropriate.  Cranial nerves:  Pupils are equal and briskly reactive to light.  Extraocular movements in vertical and horizontal planes were restricted , he seemed to see less peripherally on the right and could'nt Look up . Downward gaze is intact but he still walks bend over.   Visual fields by finger perimetry are intact.  Hearing : Significantly decreased Facial sensation intact to fine touch.  Facial motor strength is symmetric and tongue and uvula move midline. Facial expression is limited in a masked face.  Motor exam: diffusely tone and normal muscle bulk - he still has symmetric normal strength in all extremities.  Sensory: Fine touch, pinprick and vibration were tested  in all extremities. Mild Resting tremor.  Coordination: Rapid alternating movements in the fingers/hands were slowed - Finger-to-nose maneuver tested and slow , There is evidence of dysmetria. Gait and station: Patient walks without assistive device But is shuffling, his previously erect posture Has vanished. Strength within normal limits. Stance is stable and normal. Tandem gait is impaired, he turns with 7-8 Steps are unfragmented. Romberg testing is abnormal .  Deep tendon reflexes: in the upper and lower extremities are symmetric and intact.   DIAGNOSTIC DATA (LABS, IMAGING, TESTING) - I reviewed patient records, labs, notes, testing and imaging myself where available.  Lab Results  Component Value Date   WBC 5.4 11/11/2012   HGB 13.4 11/11/2012   HCT 39.5 11/11/2012   MCV 91.4 11/11/2012   PLT 108* 11/11/2012      Component Value Date/Time   NA 142 11/11/2012 0927   K 4.1 11/11/2012 0927   CL 107 11/11/2012 0927   CO2 30 11/11/2012 0927   GLUCOSE 81 11/11/2012 0927   BUN 22 11/11/2012 0927   CREATININE 1.28 11/11/2012 0927   CREATININE 1.11 08/05/2011 1116   CALCIUM 9.1 11/11/2012 0927   PROT 6.3 11/11/2012 0927   ALBUMIN 4.2 11/11/2012 0927   AST 23 11/11/2012 0927   ALT <8 11/11/2012 0927   ALKPHOS 73 11/11/2012 0927   BILITOT 1.8* 11/11/2012 0927   BILITOT 1.8* 11/11/2012 0927   GFRNONAA 59* 08/05/2011 1116   GFRAA 68* 08/05/2011 1116    ASSESSMENT AND PLAN  79 y.o. year old male  has a past medical history of Colonic polyp; Diverticulosis of colon; HLD (hyperlipidemia); HTN (hypertension); Primary pulmonary HTN; Benign hypertensive heart disease without heart failure; CAD (coronary artery disease); Pneumonia; Cancer; Arthritis; DEMENTIA; Allergy; Blood transfusion without reported diagnosis; Cataract; Syncope and collapse; S/P biopsy; and Progressive gait disorder. here for follow up of Parkinson's  and Dementia.  Worsening memory as evidenced by MMSE, AFT and Clock drawing.  PLAN: Start Namenda Titration  pack according to package instructions.  When complete, switch to Namenda XR 28mg .  I am giving you a sample of 1 week, Take 1 daily.  I have sent a new prescription for Namenda XR 28mg  to [facility].  Increase Sinemet to 1.5 tablets three times a day.  New Rx sent to pharmacy. I have ordered Home Health Physical Therapy for gait and balance training.  Call our office if they don't contact you by the end of the week.  Follow up in 6 months, sooner as needed.   Orders Placed This Encounter  Procedures  . Ambulatory referral to Home Health   Meds ordered this encounter  Medications  . Memantine HCl ER (NAMENDA XR) 28 MG CP24    Sig: Take 28 mg by mouth at bedtime.    Dispense:  30 capsule    Refill:  11    Order Specific Question:  Supervising Provider    Answer:  Joycelyn Schmid R [[id number]]  . donepezil (ARICEPT) 10 MG tablet    Sig: Take 1 tablet (10 mg total) by mouth daily.    Dispense:  90 tablet    Refill:  3    Order Specific Question:  Supervising Provider    Answer:  Joycelyn Schmid R [[id number]]  . carbidopa-levodopa (SINEMET IR) 25-100 MG per tablet    Sig: Take 40 minutes before or after a meal. Take last dose at 6 PM not later.  Rec : intake times , 7 AM , 12 and 5 PM.    Dispense:  135 tablet    Refill:  11    Order Specific Question:  Supervising Provider    Answer:  Suanne Marker [[id number]]    Arthur Acklin NP-C 12/23/2012, 10:40 AM  [facility] [address] [place], [place] [address] [phone]

## 2012-12-25 NOTE — Progress Notes (Signed)
I reviewed note and agree with plan.   Suanne Marker, MD 12/25/2012, 11:25 AM Certified in Neurology, Neurophysiology and Neuroimaging  [facility] [address] [place], [place] [address] [phone]

## 2013-01-01 ENCOUNTER — Telehealth: Payer: Self-pay | Admitting: Neurology

## 2013-01-01 NOTE — Telephone Encounter (Signed)
More PT is fine. Thanks.

## 2013-01-01 NOTE — Telephone Encounter (Signed)
WID:  PT left message for a verbal order to continue PT for the patient, and also wanted to add ST for evaluation for memory and cognitive training.  I will be glad to call PT if you agree to verbal order.

## 2013-01-02 NOTE — Telephone Encounter (Signed)
WID:  I got the verbal order to continue the PT, but the physical therapist would like to have speech therapy come in to evaluate memory and cognitive training.  Is speech therapy agreeable.

## 2013-01-02 NOTE — Telephone Encounter (Signed)
Please initiate ST referral for evaluation and treatment per physical therapy request, indication: Parkinsonism

## 2013-03-17 ENCOUNTER — Telehealth: Payer: Self-pay | Admitting: Family Medicine

## 2013-03-17 ENCOUNTER — Ambulatory Visit (INDEPENDENT_AMBULATORY_CARE_PROVIDER_SITE_OTHER): Payer: Medicare Other | Admitting: Emergency Medicine

## 2013-03-17 VITALS — BP 182/76 | HR 52 | Temp 97.3°F | Resp 16

## 2013-03-17 DIAGNOSIS — G20 Parkinson's disease: Secondary | ICD-10-CM

## 2013-03-17 DIAGNOSIS — M25569 Pain in unspecified knee: Secondary | ICD-10-CM

## 2013-03-17 DIAGNOSIS — I10 Essential (primary) hypertension: Secondary | ICD-10-CM

## 2013-03-17 LAB — COMPREHENSIVE METABOLIC PANEL
ALT: 8 U/L (ref 0–53)
AST: 24 U/L (ref 0–37)
Albumin: 4.1 g/dL (ref 3.5–5.2)
Alkaline Phosphatase: 85 U/L (ref 39–117)
BUN: 27 mg/dL — ABNORMAL HIGH (ref 6–23)
Potassium: 4.5 mEq/L (ref 3.5–5.3)
Sodium: 143 mEq/L (ref 135–145)
Total Bilirubin: 1.4 mg/dL — ABNORMAL HIGH (ref 0.3–1.2)
Total Protein: 6.4 g/dL (ref 6.0–8.3)

## 2013-03-17 NOTE — Progress Notes (Signed)
  Subjective:    Patient ID: Arthur Lane, male    DOB: 12/02/1923, 79 y.o.   MRN: [id number]  HPI Patient here to followup multiple medical problems. He has a history of parkinsonism as well as hypertension and initially was having presyncopal episodes associated with hypoxia. Overall he's been doing well. His main complaint is of a tight sensation and cramping which occurs in his quadriceps muscles when he tries to ambulate. He recently had adjustments on his parkinsonism medicines he has had physical therapy at home.   Review of Systems     Objective:   Physical Exam Arthur Lane looks good today. He is in no distress. His chest is clear to there is a 2/6 systolic murmur at the upper left sternal border and at the base of the heart abdomen is soft nontender. There is mild tenderness to the quads bilaterally. He definitely has tightness of the quad muscles there is no focal weakness to dorsiflexion or plantar flexion at the ankles. He is able to ambulate slow with some retropulsion.       Assessment & Plan:  I suspect his leg discomfort is related to the rigidity associated with his parkinsonism. We'll get an orthopedic referral about this. He has had his parkinsonism drugs adjusted recently and has also gone through physical therapy. His wife is going to work on Loss adjuster, chartered

## 2013-03-17 NOTE — Telephone Encounter (Signed)
Hi Lupita Leash, Dr Cleta Alberts wants this patient to go to [facility] for the referral he put in.  Thanks,  Marcelino Duster

## 2013-03-18 ENCOUNTER — Other Ambulatory Visit: Payer: Self-pay | Admitting: Emergency Medicine

## 2013-03-18 DIAGNOSIS — D61818 Other pancytopenia: Secondary | ICD-10-CM

## 2013-03-18 LAB — CBC WITH DIFFERENTIAL/PLATELET
Basophils Absolute: 0 10*3/uL (ref 0.0–0.1)
Basophils Relative: 0 % (ref 0–1)
Eosinophils Absolute: 0 10*3/uL (ref 0.0–0.7)
MCH: 31.4 pg (ref 26.0–34.0)
MCHC: 34.6 g/dL (ref 30.0–36.0)
Neutro Abs: 2.4 10*3/uL (ref 1.7–7.7)
Neutrophils Relative %: 72 % (ref 43–77)
RDW: 14.4 % (ref 11.5–15.5)

## 2013-03-18 NOTE — Telephone Encounter (Signed)
Spoke to Dr Tama Gander nurse Ivon Oelkers, she is trying to get patient in, I have given her your cell to call if Dr Myna Hidalgo has any questions about the referral

## 2013-03-18 NOTE — Telephone Encounter (Signed)
I spoke with Dr. Tama Gander  nurse and she is going to review the CBC with him and will give me a call back tomorrow regarding instructions. See if the lab has the blood available to do a path review

## 2013-03-19 ENCOUNTER — Telehealth: Payer: Self-pay | Admitting: *Deleted

## 2013-03-19 ENCOUNTER — Other Ambulatory Visit: Payer: Self-pay

## 2013-03-19 ENCOUNTER — Telehealth: Payer: Self-pay | Admitting: Radiology

## 2013-03-19 NOTE — Telephone Encounter (Signed)
Called [name] wife/ left message for her to call me back, patient needs to come in for repeat CBC/ office visit with Dr Cleta Alberts at 104 in the next 1-2 weeks, after he d/c the Namenda. When can we get him in for this?

## 2013-03-19 NOTE — Telephone Encounter (Signed)
Dr Cleta Alberts wants you to be aware he has d/c patients Namenda due to his white count. He will have patient follow up in the next 1-2 weeks for repeat of the CBC.

## 2013-03-19 NOTE — Telephone Encounter (Signed)
Spoke to [place], advised and she will have patient d/c the Namenda, and is awaiting our call to schedule appt within 2 weeks. Please advise.

## 2013-03-19 NOTE — Telephone Encounter (Signed)
Received an urgent consult from Dr Cleta Alberts on 03/18/13 with a request to have a RN call his office. Spoke to him yesterday regarding his new on-set pancytopenia. He is asymptomatic (no fevers, bleeding, brusing, or SOB) other than muscle cramps. He has Parkinson's disease and dementia. He was seen by his neurologist in Aug who adjusted his medications. His case was reviewed by Dr Myna Hidalgo and the on site pharmacist. Post marketing studies have shown some cases of thrombocytopenia and/or pancytopenia with Namenda XR. It seems to be dose related. Dr Myna Hidalgo suggested the pt be taken off the Namenda XR for 3 - 4 weeks then have his CBC rechecked. If he is still pancytopenic, then he will need to been by Dr Myna Hidalgo to evaluate his blood smear and possible BMBX. Dr Cleta Alberts was in agreement to hold the Namenda to avoid BMBX at this time. He will call the pt with the instructions and let us know if we need to see the pt in the future.

## 2013-03-20 NOTE — Telephone Encounter (Signed)
Appointment made for 11/25 @ 4:15 pm.

## 2013-03-20 NOTE — Telephone Encounter (Signed)
Patient's wife is going to call back regarding appointment with Dr Cleta Alberts.

## 2013-03-26 NOTE — Telephone Encounter (Signed)
Thank you for letting me know about Arthur Lane WBC count and the discontinuation of Namenda.  C. Nussen Pullin.

## 2013-04-07 ENCOUNTER — Encounter: Payer: Self-pay | Admitting: Emergency Medicine

## 2013-04-07 ENCOUNTER — Ambulatory Visit (INDEPENDENT_AMBULATORY_CARE_PROVIDER_SITE_OTHER): Payer: Medicare Other | Admitting: Emergency Medicine

## 2013-04-07 VITALS — BP 126/70 | HR 53 | Temp 97.8°F | Resp 16 | Ht 68.0 in | Wt 140.0 lb

## 2013-04-07 DIAGNOSIS — D61818 Other pancytopenia: Secondary | ICD-10-CM

## 2013-04-07 DIAGNOSIS — M79609 Pain in unspecified limb: Secondary | ICD-10-CM

## 2013-04-07 DIAGNOSIS — M25569 Pain in unspecified knee: Secondary | ICD-10-CM

## 2013-04-07 DIAGNOSIS — G20 Parkinson's disease: Secondary | ICD-10-CM

## 2013-04-07 LAB — POCT CBC
Granulocyte percent: 64.3 %G (ref 37–80)
HCT, POC: 44.1 % (ref 43.5–53.7)
Hemoglobin: 13.6 g/dL — AB (ref 14.1–18.1)
MPV: 11.5 fL (ref 0–99.8)
POC Granulocyte: 3.8 (ref 2–6.9)
POC MID %: 7.5 %M (ref 0–12)
RBC: 4.32 M/uL — AB (ref 4.69–6.13)

## 2013-04-07 NOTE — Progress Notes (Signed)
  Subjective:    Patient ID: Arthur Lane, male    DOB: 21-Oct-1923, 79 y.o.   MRN: [id number]  HPI patient in for followup of his pancytopenia. He recently had been started on Namenda. I spoke with Dr. Dewayne Hatch of her felt that this possibly could have been secondary to the [facility]. This medication was stopped and he was advised to return to clinic for followup CBC. He continues to complain of back pain. He also has become more weak in his lower extremities with difficulty on ambulation. He has a definite history of parkinsonism    Review of Systems     Objective:   Physical Exam Ed looks good today. He is in no distress. Chest clear heart regular rate no murmurs he is weak from going from sitting to standing. He needs total support with ambulation.  Results for orders placed in visit on 04/07/13  POCT CBC      Result Value Range   WBC 5.9  4.6 - 10.2 K/uL   Lymph, poc 1.7  0.6 - 3.4   POC LYMPH PERCENT 28.2  10 - 50 %L   MID (cbc) 0.4  0 - 0.9   POC MID % 7.5  0 - 12 %M   POC Granulocyte 3.8  2 - 6.9   Granulocyte percent 64.3  37 - 80 %G   RBC 4.32 (*) 4.69 - 6.13 M/uL   Hemoglobin 13.6 (*) 14.1 - 18.1 g/dL   HCT, POC 40.9  81.1 - 53.7 %   MCV 102.1 (*) 80 - 97 fL   MCH, POC 31.5 (*) 27 - 31.2 pg   MCHC 30.8 (*) 31.8 - 35.4 g/dL   RDW, POC 91.4     Platelet Count, POC 99 (*) 142 - 424 K/uL   MPV 11.5  0 - 99.8 fL       Assessment & Plan:  Labs are much better.Referral to Dr. Thurston Hole for leg pain. His pancytopenia has essentially resolved. It must have been due to his Namenda

## 2013-04-08 LAB — COMPREHENSIVE METABOLIC PANEL
ALT: <8 U/L (ref 0–53)
Albumin: 4 g/dL (ref 3.5–5.2)
Alkaline Phosphatase: 81 U/L (ref 39–117)
CO2: 29 mEq/L (ref 19–32)
Chloride: 106 mEq/L (ref 96–112)
Glucose, Bld: 89 mg/dL (ref 70–99)
Potassium: 4 mEq/L (ref 3.5–5.3)
Sodium: 142 mEq/L (ref 135–145)
Total Bilirubin: 1.5 mg/dL — ABNORMAL HIGH (ref 0.3–1.2)
Total Protein: 6.6 g/dL (ref 6.0–8.3)

## 2013-04-10 LAB — IMMUNOFIXATION ELECTROPHORESIS
IgA: 237 mg/dL (ref 68–379)
IgG (Immunoglobin G), Serum: 983 mg/dL (ref 650–1600)

## 2013-04-10 LAB — PROTEIN ELECTROPHORESIS, SERUM
Albumin ELP: 60.4 % (ref 55.8–66.1)
Beta 2: 5.6 % (ref 3.2–6.5)
Beta Globulin: 5.6 % (ref 4.7–7.2)
Gamma Globulin: 15.3 % (ref 11.1–18.8)
Total Protein, Serum Electrophoresis: 6.6 g/dL (ref 6.0–8.3)

## 2013-05-26 ENCOUNTER — Telehealth: Payer: Self-pay | Admitting: Cardiology

## 2013-05-26 DIAGNOSIS — E785 Hyperlipidemia, unspecified: Secondary | ICD-10-CM

## 2013-05-26 DIAGNOSIS — I2510 Atherosclerotic heart disease of native coronary artery without angina pectoris: Secondary | ICD-10-CM

## 2013-05-26 NOTE — Telephone Encounter (Signed)
New problem   Pt's wife want pt to have labs drawn days before his appt and she want to have it done at [facility]. Please call pt concerning this matter.

## 2013-05-26 NOTE — Telephone Encounter (Signed)
Spoke with pt wife, orders placed for labs to be drawn at [address] office

## 2013-06-08 ENCOUNTER — Encounter: Payer: Self-pay | Admitting: *Deleted

## 2013-06-08 ENCOUNTER — Other Ambulatory Visit (INDEPENDENT_AMBULATORY_CARE_PROVIDER_SITE_OTHER): Payer: Medicare Other

## 2013-06-08 DIAGNOSIS — E785 Hyperlipidemia, unspecified: Secondary | ICD-10-CM

## 2013-06-08 DIAGNOSIS — I2510 Atherosclerotic heart disease of native coronary artery without angina pectoris: Secondary | ICD-10-CM

## 2013-06-08 LAB — HEPATIC FUNCTION PANEL
ALT: 5 U/L (ref 0–53)
AST: 24 U/L (ref 0–37)
Albumin: 3.9 g/dL (ref 3.5–5.2)
Alkaline Phosphatase: 77 U/L (ref 39–117)
BILIRUBIN DIRECT: 0.3 mg/dL (ref 0.0–0.3)
Total Bilirubin: 1.9 mg/dL — ABNORMAL HIGH (ref 0.3–1.2)
Total Protein: 6.7 g/dL (ref 6.0–8.3)

## 2013-06-08 LAB — LIPID PANEL
CHOL/HDL RATIO: 2
Cholesterol: 104 mg/dL (ref 0–200)
HDL: 43.5 mg/dL (ref >39.00)
LDL CALC: 42 mg/dL (ref 0–99)
Triglycerides: 92 mg/dL (ref 0.0–149.0)
VLDL: 18.4 mg/dL (ref 0.0–40.0)

## 2013-06-15 ENCOUNTER — Encounter: Payer: Self-pay | Admitting: Cardiology

## 2013-06-15 ENCOUNTER — Encounter: Payer: Self-pay | Admitting: *Deleted

## 2013-06-15 ENCOUNTER — Ambulatory Visit (INDEPENDENT_AMBULATORY_CARE_PROVIDER_SITE_OTHER): Payer: Medicare Other | Admitting: Cardiology

## 2013-06-15 VITALS — BP 122/66 | HR 51 | Ht 68.5 in | Wt 140.0 lb

## 2013-06-15 DIAGNOSIS — E785 Hyperlipidemia, unspecified: Secondary | ICD-10-CM

## 2013-06-15 DIAGNOSIS — I359 Nonrheumatic aortic valve disorder, unspecified: Secondary | ICD-10-CM

## 2013-06-15 DIAGNOSIS — I2510 Atherosclerotic heart disease of native coronary artery without angina pectoris: Secondary | ICD-10-CM

## 2013-06-15 DIAGNOSIS — I350 Nonrheumatic aortic (valve) stenosis: Secondary | ICD-10-CM

## 2013-06-15 DIAGNOSIS — R55 Syncope and collapse: Secondary | ICD-10-CM

## 2013-06-15 NOTE — Progress Notes (Signed)
HPI: FU CAD (bypass surgery in 1980). He later had PCI of the circumflex artery. He has hypertension and also has had postural hypotension. He also has chronic symptoms of dizziness. He was seen by Dr. Sander Nephew who thought his dizziness was multifactorial and recommended that he could participate in the neurology rehabilitation program. Echocardiogram in June 2012 showed normal LV function, grade 2 diastolic dysfunction, mild aortic stenosis, mild mitral valve prolapse, mild MR and biatrial enlargement. Last Myoview in April 2013 showed an ejection fraction of 63% and no ischemia or infarction. Admitted in March with pneumonia. Patient seen for syncope in March of 2013 by Richardson Dopp. The above Myoview was ordered. Cardionet was delayed because of difficulty managing at rehab but revealed bradycardia, sinus with pacs and pvcs. It was felt his syncope may be orthostatic mediated. When previuously seen patient having episodes of hypoxia with ambulation with saturations in the high 70s and 80s. His heart rate is in the 50s. He becomes confused until he sits down. Patient was seen by pulmonary and VQ scan was low probability. I last saw him in May of 2013. Since then, he has decreased mobility from Parkinson's. There is mild dyspnea on exertion but no orthopnea, PND or pedal edema. No chest pain or syncope.   Current Outpatient Prescriptions  Medication Sig Dispense Refill  . aspirin EC 325 MG tablet Take 325 mg by mouth daily.      . carbidopa-levodopa (SINEMET IR) 25-100 MG per tablet Take 40 minutes before or after a meal. Take last dose at 6 PM not later.  Rec : intake times , 7 AM , 12 and 5 PM.  135 tablet  11  . donepezil (ARICEPT) 10 MG tablet Take 1 tablet (10 mg total) by mouth daily.  90 tablet  3  . Prenatal MV-Min-Fe Fum-FA-DHA (CENTRUM SPECIALIST PRENATAL PO) Take 1 capsule by mouth daily.      [name] ZETIA 10 MG tablet take 1 tablet by mouth once daily  30 tablet  11  . simvastatin  (ZOCOR) 40 MG tablet Take 40 mg by mouth every evening.       No current facility-administered medications for this visit.     Past Medical History  Diagnosis Date  . Colonic polyp   . Diverticulosis of colon   . HLD (hyperlipidemia)   . HTN (hypertension)   . Primary pulmonary HTN   . Benign hypertensive heart disease without heart failure   . CAD (coronary artery disease)   . Pneumonia   . Cancer     hx of skin ca  . Arthritis   . DEMENTIA   . Allergy   . Blood transfusion without reported diagnosis   . Cataract   . Syncope and collapse   . S/P biopsy     positive  prostatee biospy  . Progressive gait disorder   . Parkinsonism   . Aortic stenosis     Past Surgical History  Procedure Laterality Date  . Coronary artery bypass graft  1980  . Laparoscopic cholecystectomy    . Ptca    . Eye surgery    . Coronary artery bypass graft      [place], [place] cataract removed    History   Social History  . Marital Status: Married    Spouse Name: N/A    Number of Children: 2  . Years of Education: N/A   Occupational History  . retired    Social History  Main Topics  . Smoking status: Former Smoker    Quit date: 05/15/1976  . Smokeless tobacco: Not on file  . Alcohol Use: No     Comment: rarely  . Drug Use: No  . Sexual Activity: No   Other Topics Concern  . Not on file   Social History Narrative  . No narrative on file    ROS: no fevers or chills, productive cough, hemoptysis, dysphasia, odynophagia, melena, hematochezia, dysuria, hematuria, rash, seizure activity, orthopnea, PND, pedal edema, claudication. Remaining systems are negative.  Physical Exam: Well-developed frail in no acute distress.  Skin is warm and dry.  HEENT is normal.  Neck is supple.  Chest is clear to auscultation with normal expansion.  Cardiovascular exam is regular rate and rhythm. 3/6 systolic murmur left sternal border. Abdominal exam nontender or distended. No masses  palpated. Extremities show no edema. neuro grossly intact  ECG sinus rhythm with first degree AV block. Normal axis. Poor R wave progression. Nonspecific ST changes.

## 2013-06-15 NOTE — Assessment & Plan Note (Signed)
Repeat echocardiogram. 

## 2013-06-15 NOTE — Assessment & Plan Note (Signed)
Continue statin. 

## 2013-06-15 NOTE — Assessment & Plan Note (Signed)
No further episodes

## 2013-06-15 NOTE — Assessment & Plan Note (Signed)
Continue aspirin and statin. 

## 2013-06-15 NOTE — Patient Instructions (Signed)

## 2013-07-07 ENCOUNTER — Other Ambulatory Visit (HOSPITAL_COMMUNITY): Payer: Medicare Other

## 2013-07-09 ENCOUNTER — Ambulatory Visit: Payer: Medicare Other | Admitting: Neurology

## 2013-07-10 IMAGING — CR DG CHEST 2V
1 series · 1 of 1 positions shown · non-contrast
Comparison: None

CLINICAL DATA: Altered mental status

CHEST - 2 VIEW

[w chest lat]
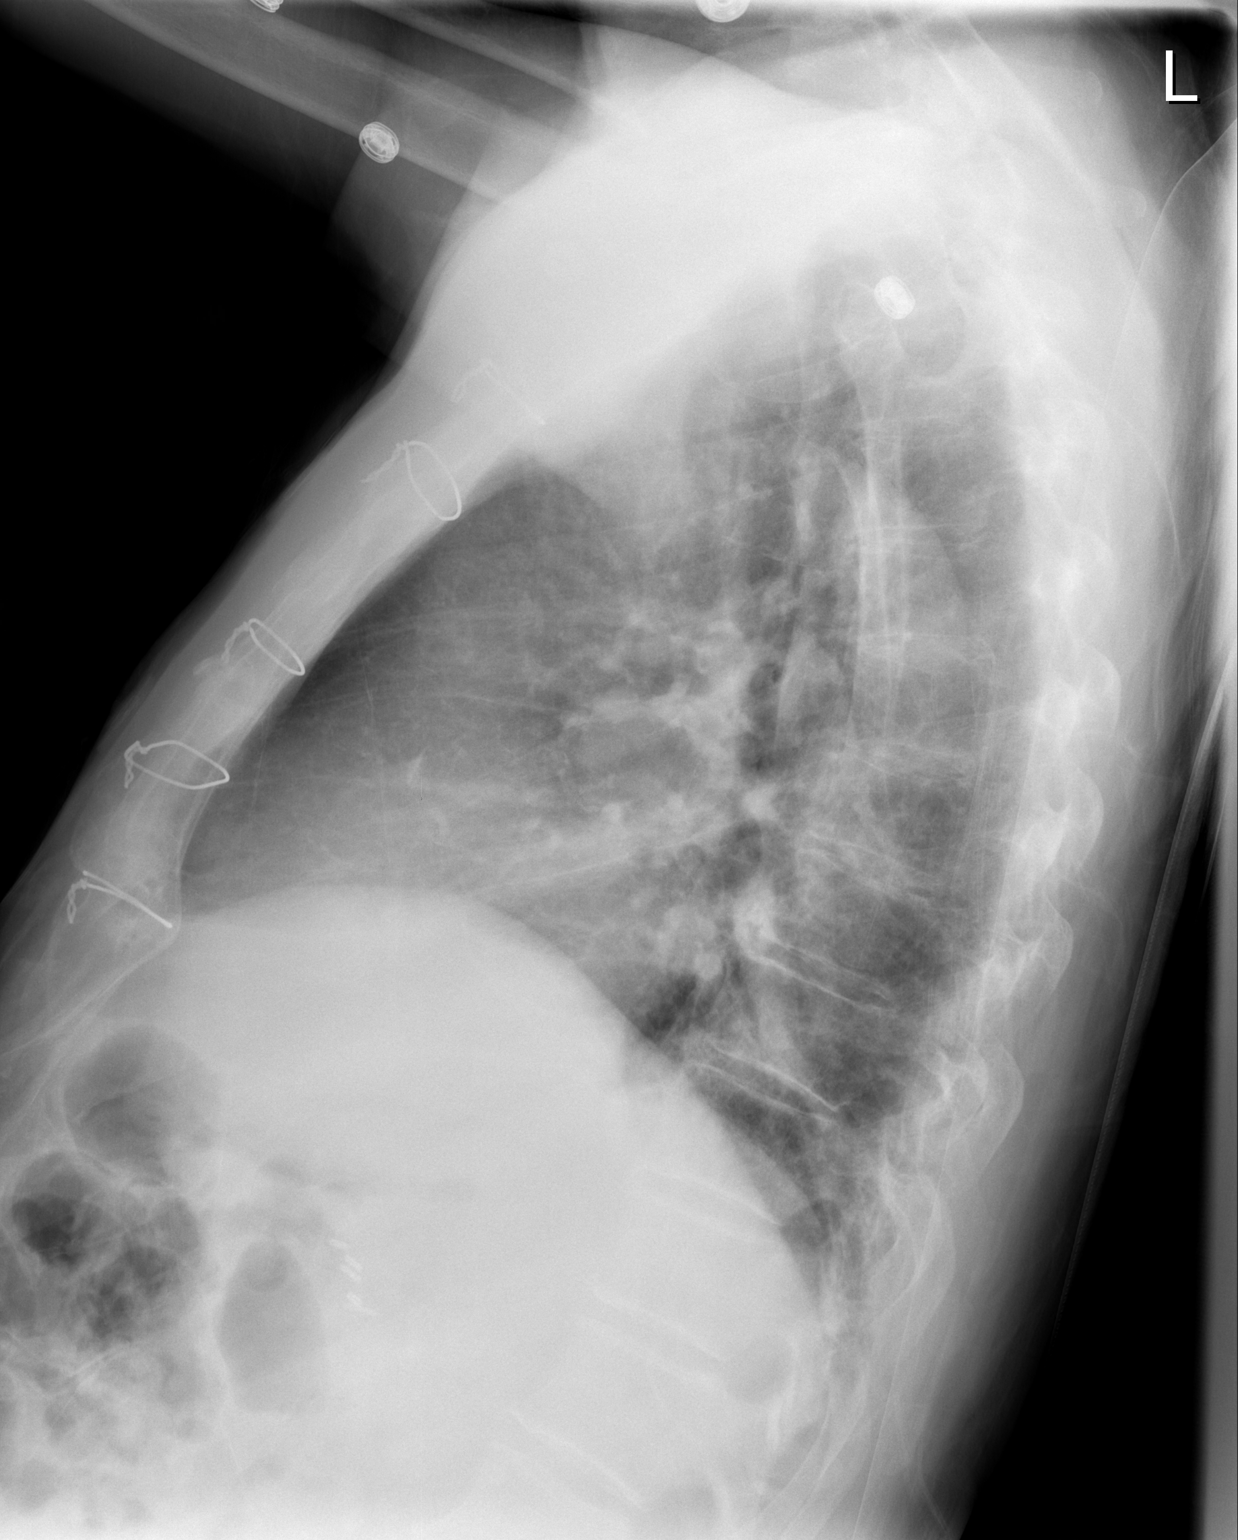

[1 of 1 positions shown; findings below may reference images not displayed]

FINDINGS: Mild cardiac enlargement.

Prior CABG.

No effusion identified.

The right lung appears clear.

Opacity in the left base is noted which may represent infiltrate or
atelectasis.
IMPRESSION: 1.  Left base opacity consistent with infiltrate and/or
atelectasis.

## 2013-07-29 ENCOUNTER — Ambulatory Visit (HOSPITAL_COMMUNITY): Payer: Medicare Other | Attending: Cardiology | Admitting: Radiology

## 2013-07-29 DIAGNOSIS — I079 Rheumatic tricuspid valve disease, unspecified: Secondary | ICD-10-CM | POA: Insufficient documentation

## 2013-07-29 DIAGNOSIS — R55 Syncope and collapse: Secondary | ICD-10-CM | POA: Insufficient documentation

## 2013-07-29 DIAGNOSIS — I359 Nonrheumatic aortic valve disorder, unspecified: Secondary | ICD-10-CM | POA: Insufficient documentation

## 2013-07-29 DIAGNOSIS — I2510 Atherosclerotic heart disease of native coronary artery without angina pectoris: Secondary | ICD-10-CM | POA: Insufficient documentation

## 2013-07-29 DIAGNOSIS — E785 Hyperlipidemia, unspecified: Secondary | ICD-10-CM | POA: Insufficient documentation

## 2013-07-29 DIAGNOSIS — I10 Essential (primary) hypertension: Secondary | ICD-10-CM | POA: Insufficient documentation

## 2013-07-29 DIAGNOSIS — Z87891 Personal history of nicotine dependence: Secondary | ICD-10-CM | POA: Insufficient documentation

## 2013-07-29 DIAGNOSIS — I379 Nonrheumatic pulmonary valve disorder, unspecified: Secondary | ICD-10-CM | POA: Insufficient documentation

## 2013-07-29 NOTE — Progress Notes (Signed)
Echocardiogram Performed. 

## 2013-08-07 ENCOUNTER — Ambulatory Visit (INDEPENDENT_AMBULATORY_CARE_PROVIDER_SITE_OTHER): Payer: Medicare Other | Admitting: Neurology

## 2013-08-07 ENCOUNTER — Encounter: Payer: Self-pay | Admitting: Neurology

## 2013-08-07 VITALS — BP 177/84 | HR 50 | Wt 141.0 lb

## 2013-08-07 DIAGNOSIS — R269 Unspecified abnormalities of gait and mobility: Secondary | ICD-10-CM

## 2013-08-07 DIAGNOSIS — R413 Other amnesia: Secondary | ICD-10-CM

## 2013-08-07 DIAGNOSIS — G20 Parkinson's disease: Secondary | ICD-10-CM

## 2013-08-07 DIAGNOSIS — F0280 Dementia in other diseases classified elsewhere without behavioral disturbance: Secondary | ICD-10-CM | POA: Insufficient documentation

## 2013-08-07 MED ORDER — DONEPEZIL HCL 23 MG PO TABS: 23.0000 mg | ORAL_TABLET | Freq: Every day | ORAL | Status: AC

## 2013-08-07 MED ORDER — CARBIDOPA-LEVODOPA 25-100 MG PO TABS: ORAL_TABLET | ORAL | Status: AC

## 2013-08-07 NOTE — Progress Notes (Signed)
[facility]  PATIENT: Arthur Lane DOB: 26-Sep-1923   HISTORY FROM: patient, wife, chart REASON FOR VISIT: follow up on parkinsonism and dementia, progressive gait disorder.    HISTORICAL  CHIEF COMPLAINT:  Chief Complaint  Patient presents with  . Follow-up    Room 10  . Parkinson's    MMSE- 19/30   AFT- 8    HISTORY OF PRESENT ILLNESS: PRIOR HPI 08/19/12 (CD): Arthur Lane is a 80 y.o.  Caucasian , right handed male here as a revisit ( Dr. Everlene Farrier is PCP )  for follow up of progressive decline in gait and memory.  Arthur Lane. is a 80 year old Caucasian male, right-handed, married, and presented with lightheadedness and dizziness in the past, but also has a progressive gait disorder and progressive memory problems.  The patient has been on multiple antihypertensive medications in the past and modification of his HTN regimen had helped him to have less pre-syncopal spells. It was presumed that he has ortho-static hypotension but he also has a hoarseness of the voice and buckling knee, shuffling gait. An EEG in the past about 3 years ago was slow.   Transcranial Doppler studies were showing left vertebral artery stenosis in the past. Dr. 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   According to his wife the problem with equilibrium has been less  evident now, he is using more often a wheelchair and is very careful. No falls in the last 8 month. Last year she had reported that  when Arthur Lane. has had sat for a while and gets up from a seated position on it that seem to have been worse when he was sitting in a warm place. He is not on Blood pressure medication. His knees are locking, he is not in any pain. He is more fluent with his movements when he is 1-3 hours after sinemet intake time, and at the end of dose he is shuffling a little more , is more rigid. She feels he is doing better on sinemet.   But he has a high risk of falling.  It is his hearing has significantly  declined. He also endorsed the were as sleepiness scale of 12 points, has micro graphia and resting tremor, with pill-rolling character. He  scored  again on 08-07-13 -19/30 points on a Mini-Mental status evaluation on which he scored  17 in 2011 and 23 point in 2010.    Reported loss of interest in Reading and social activities has not changed. .  Tolerating Sinemet 25-100 three times daily, admin at 7, 12, and 5.  Patient tolerating medication well, but reports that at times he feels "tightness" around his knees and weakness it his quadriceps.  He feels at times that he is frozen and cannot take another step. Mild problem getting up from seated position.   Wife states they were contacted about going to outpatient PT at [facility], but it is too difficult for her to get Arthur Lane there; she is afraid he will fall.    REVIEW OF SYSTEMS: Full 14 system review of systems performed and notable only for:  Constitutional: N/A  Cardiovascular: N/A  Ear/Nose/Throat: hearing loss, dysphonia, dysphagia  Skin: N/A  Eyes: N/A  Respiratory: N/A  Gastroitestinal: N/A  Genitourinary: Incontinence Hematology/Lymphatic: N/A  Endocrine: N/A Musculoskeletal:N/A  Allergy/Immunology: N/A      ALLERGIES: Allergies  Allergen Reactions  . Namenda [Memantine Hcl]     Patient developed pancytopenia which resolved on discontinuation of the  drug  . Pollen Extract     HOME MEDICATIONS: Outpatient Prescriptions Prior to Visit  Medication Sig Dispense Refill  . aspirin EC 325 MG tablet Take 325 mg by mouth daily.      . carbidopa-levodopa (SINEMET IR) 25-100 MG per tablet Take 40 minutes before or after a meal. Take last dose at 6 PM not later.  Rec : intake times , 7 AM , 12 and 5 PM.  135 tablet  11  . donepezil (ARICEPT) 10 MG tablet Take 1 tablet (10 mg total) by mouth daily.  90 tablet  3  . Prenatal MV-Min-Fe Fum-FA-DHA (CENTRUM SPECIALIST PRENATAL PO) Take 1 capsule by mouth daily.      [name] ZETIA 10 MG tablet take  1 tablet by mouth once daily  30 tablet  11  . simvastatin (ZOCOR) 40 MG tablet Take 40 mg by mouth every evening.       No facility-administered medications prior to visit.    PAST MEDICAL HISTORY: Past Medical History  Diagnosis Date  . Colonic polyp   . Diverticulosis of colon   . HLD (hyperlipidemia)   . HTN (hypertension)   . Primary pulmonary HTN   . Benign hypertensive heart disease without heart failure   . CAD (coronary artery disease)   . Pneumonia   . Cancer     hx of skin ca  . Arthritis   . DEMENTIA   . Allergy   . Blood transfusion without reported diagnosis   . Cataract   . Syncope and collapse   . S/P biopsy     positive  prostatee biospy  . Progressive gait disorder   . Parkinsonism   . Aortic stenosis     PAST SURGICAL HISTORY: Past Surgical History  Procedure Laterality Date  . Coronary artery bypass graft  1980  . Laparoscopic cholecystectomy    . Ptca    . Eye surgery    . Coronary artery bypass graft      [place], had one cataract removed    FAMILY HISTORY: Family History  Problem Relation Age of Onset  . Dementia Mother     died at 66    SOCIAL HISTORY: History   Social History  . Marital Status: Married    Spouse Name: Pamala Hurry    Number of Children: 2  . Years of Education: Masters   Occupational History  . retired    Social History Main Topics  . Smoking status: Former Smoker    Quit date: 05/15/1976  . Smokeless tobacco: Never Used  . Alcohol Use: No     Comment: rarely  . Drug Use: No  . Sexual Activity: No   Other Topics Concern  . Not on file   Social History Narrative   Patient is married Pamala Hurry) and lives at home with his wife.   Patient has two children.   Patient is retired.   Patient has a Master's degree.   Patient is right-handed.   Patient drinks very little caffeine.     PHYSICAL EXAM  Filed Vitals:   08/07/13 1110  BP: 177/84  Pulse: 50  Weight: 141 lb (63.957 kg)   Cannot  calculate BMI with a height equal to zero.  Physical exam:  General: The patient is awake, alert and appears not in acute distress. The patient is well groomed. His masked face and resting tremor and posture is slightly bend forwards.  Head: Normocephalic, atraumatic. Neck is supple. : Cardiovascular: Regular rate and  rhythm, systolic 2/6 murmur or carotid bruit, and without distended neck veins.  Respiratory: Lungs are clear to auscultation. The patient provides minimal effort.  Skin: Without evidence of edema, or rash  Trunk: BMI is normal  Neurologic exam :  The patient is awake and alert, oriented to place and time. Memory Impaired , there is a reduced attention span & concentration ability.  Speech is sparse due to  dysphonia and word finding delay , mild  aphasia. Hoarse voice , and microphonia - trailing off. MMSE stable for 12 month 19-30.  He has micrographia, too.  Mood and affect are appropriate, anxious at times, reports visual hallucinations form sinemet. .  Cranial nerves:  Pupils are equal and briskly reactive to light.  Extraocular movements in vertical and horizontal planes were restricted , he seemed to see less peripherally on the right and could'nt Look up . Downward gaze is intact but he still walks bend over.   Visual fields by finger perimetry are intact.  Hearing : Significantly decreased Facial sensation intact to fine touch.  Facial motor strength is symmetric and tongue and uvula move midline. Facial expression is limited in a masked face.  Motor exam: diffusely tone and normal muscle bulk - he still has symmetricstrength in all extremities.  Sensory: Fine touch, pinprick and vibration were tested in all extremities.  There is still a mild Resting tremor.  Coordination: Rapid alternating movements in the fingers/hands were slowed -  Finger-to-nose maneuver tested and slow , there is  dysmetria. Gait and station: Patient walks without assistive device here , but  requires a person next to him. He is  shuffling, his previously erect posture has vanished.  Strength within normal limits. Stance is stable and normal. Tandem gait is impaired, he turns with 7-8 Steps are unfragmented. Romberg testing is abnormal .  Deep tendon reflexes: in the upper and lower extremities are symmetric and intact.   DIAGNOSTIC DATA (LABS, IMAGING, TESTING) - I reviewed patient records, labs, notes, testing and imaging myself where available.  Dr. Jacalyn Lefevre labs from 06-06-13 were discussed and a copy d given to the patient's wife.   ASSESSMENT AND PLAN  8 y.o. year old male  has a past medical history of Colonic polyp; Diverticulosis of colon; HLD (hyperlipidemia); HTN (hypertension); Primary pulmonary HTN; Benign hypertensive heart disease without heart failure; CAD (coronary artery disease); Pneumonia; Cancer; Arthritis; DEMENTIA; Allergy; Blood transfusion without reported diagnosis; Cataract; Syncope and collapse; S/P biopsy; Progressive gait disorder; Parkinsonism; and Aortic stenosis. here for follow up of Parkinsonian gait disorder and Dementia. He has an axial body tremor with positive romberg and he is deeply insecure . His gait is more fluent and erect when he has a person to lean on. He is not shuffling as much .   Stable  memory as evidenced by MMSE, AFT and Clock drawing in comparison to last visit. [name]  PLAN: namenda discontued , aricept increased to 23 mg daily.  He has bradycardia . Will need to follow. Next visit with dr Everlene Farrier in may - please look at heart rate .   Increase Sinemet to 1.5 tablets three times a day.   New Rx sent to pharmacy. I have ordered [place] for gait and balance training.   Call our office if they don't contact you by the end of the month   Follow up in 4-6 months, sooner as needed.  Cramen Lebaron Bautch, MD    08/07/2013, 12:22 PM  [facility]  [address] [place], [place] [address] [phone]

## 2013-08-07 NOTE — Patient Instructions (Signed)
Donepezil tablets What is this medicine? DONEPEZIL (doe NEP e zil) is used to treat mild to moderate dementia caused by Alzheimer's disease. This medicine may be used for other purposes; ask your health care provider or pharmacist if you have questions. COMMON BRAND NAME(S): Aricept What should I tell my health care provider before I take this medicine? They need to know if you have any of these conditions: -asthma or other lung disease -difficulty passing urine -head injury -heart disease, slow heartbeat -liver disease -Parkinson's disease -seizures (convulsions) -stomach or intestinal disease, ulcers or stomach bleeding -an unusual or allergic reaction to donepezil, other medicines, foods, dyes, or preservatives -pregnant or trying to get pregnant -breast-feeding How should I use this medicine? Take this medicine by mouth with a glass of water. Follow the directions on the prescription label. You may take this medicine with or without food. Take this medicine at regular intervals. This medicine is usually taken before bedtime. Do not take it more often than directed. Continue to take your medicine even if you feel better. Do not stop taking except on your doctor's advice. If you are taking the 23 mg donepezil tablet, swallow it whole; do not cut, crush, or chew it. Talk to your pediatrician regarding the use of this medicine in children. Special care may be needed. Overdosage: If you think you have taken too much of this medicine contact a poison control center or emergency room at once. NOTE: This medicine is only for you. Do not share this medicine with others. What if I miss a dose? If you miss a dose, take it as soon as you can. If it is almost time for your next dose, take only that dose, do not take double or extra doses. What may interact with this  medicine? -atropine -benztropine -bethanechol -carbamazepine -dexamethasone -dicyclomine -glycopyrrolate -hyoscyamine -ipratropium -itraconazole or ketoconazole -medicines for motion sickness -NSAIDs, medicines for pain and inflammation, like ibuprofen or naproxen -other medicines for Alzheimer's disease -oxybutynin -phenobarbital -phenytoin -quinidine -rifampin, rifabutin or rifapentine -trihexyphenidyl This list may not describe all possible interactions. Give your health care provider a list of all the medicines, herbs, non-prescription drugs, or dietary supplements you use. Also tell them if you smoke, drink alcohol, or use illegal drugs. Some items may interact with your medicine. What should I watch for while using this medicine? Visit your doctor or health care professional for regular checks on your progress. Check with your doctor or health care professional if your symptoms do not get better or if they get worse. You may get drowsy or dizzy. Do not drive, use machinery, or do anything that needs mental alertness until you know how this drug affects you. What side effects may I notice from receiving this medicine? Side effects that you should report to your doctor or health care professional as soon as possible: -allergic reactions like skin rash, itching or hives, swelling of the face, lips, or tongue -changes in vision -feeling faint or lightheaded, falls -problems with balance -slow heartbeat, or palpitations -stomach pain -unusual bleeding or bruising, red or purple spots on the skin -vomiting -weight loss Side effects that usually do not require medical attention (report to your doctor or health care professional if they continue or are bothersome): -diarrhea, especially when starting treatment -headache -indigestion or heartburn -loss of appetite -muscle cramps -nausea This list may not describe all possible side effects. Call your doctor for medical advice  about side effects. You may report side effects to FDA at 1-800-FDA-1088.  Where should I keep my medicine? Keep out of reach of children. Store at room temperature between 15 and 30 degrees C (59 and 86 degrees F). Throw away any unused medicine after the expiration date. NOTE: This sheet is a summary. It may not cover all possible information. If you have questions about this medicine, talk to your doctor, pharmacist, or health care provider.  2014, Elsevier/Gold Standard. (2011-10-30 13:27:43)

## 2013-10-31 ENCOUNTER — Ambulatory Visit (INDEPENDENT_AMBULATORY_CARE_PROVIDER_SITE_OTHER): Payer: Medicare Other | Admitting: Emergency Medicine

## 2013-10-31 VITALS — BP 102/68 | HR 83 | Temp 98.0°F | Resp 17

## 2013-10-31 DIAGNOSIS — G20 Parkinson's disease: Secondary | ICD-10-CM

## 2013-10-31 DIAGNOSIS — R269 Unspecified abnormalities of gait and mobility: Secondary | ICD-10-CM

## 2013-10-31 LAB — POCT CBC
Granulocyte percent: 71.1 %G (ref 37–80)
HEMATOCRIT: 41.9 % — AB (ref 43.5–53.7)
Hemoglobin: 13.3 g/dL — AB (ref 14.1–18.1)
LYMPH, POC: 1.6 (ref 0.6–3.4)
MCH, POC: 31.1 pg (ref 27–31.2)
MCHC: 31.7 g/dL — AB (ref 31.8–35.4)
MCV: 97.9 fL — AB (ref 80–97)
MID (CBC): 0.7 (ref 0–0.9)
MPV: 10.8 fL (ref 0–99.8)
POC Granulocyte: 5.6 (ref 2–6.9)
POC LYMPH %: 19.7 % (ref 10–50)
POC MID %: 9.2 %M (ref 0–12)
Platelet Count, POC: 119 10*3/uL — AB (ref 142–424)
RBC: 4.28 M/uL — AB (ref 4.69–6.13)
RDW, POC: 13.8 %
WBC: 7.9 10*3/uL (ref 4.6–10.2)

## 2013-10-31 LAB — IFOBT (OCCULT BLOOD): IMMUNOLOGICAL FECAL OCCULT BLOOD TEST: POSITIVE

## 2013-10-31 MED ORDER — ERYTHROMYCIN 5 MG/GM OP OINT: 1.0000 "application " | TOPICAL_OINTMENT | Freq: Four times a day (QID) | OPHTHALMIC | Status: AC

## 2013-10-31 NOTE — Progress Notes (Addendum)
Subjective:    Patient ID: Arthur Lane, male    DOB: November 12, 1923, 80 y.o.   MRN: [id number]  This chart was scribed for Arlyss Queen, MD by Steva Colder, ED Scribe. The patient was seen in room 8 at 6:19 PM.   Chief Complaint  Patient presents with  . Eye Injury    swelling around eyes   . Dizziness    HPI Arthur Lane is a 80 y.o. male who presents today complaining of leg pain onset a couple of days ago. His relatives state that he is struggling to walk and he has gotten worse within the last week. They state that in the last day or so, he has been drinking a lot but not urinating often. His relatives states that when they take off his Depends there is blood spotting in his stool. His relatives states that in the morning he is able to slightly walk but that decreases throughout the day.  His relatives states that in the past couple of days he has gotten to the point to where he can not stand on his own. He states that after awhile it starts to ache around the knees. His son states that the pt has a strong fear of falling. His relatives states that for the past couple of days he has had a lot of crust on his eyes.  He denies head pain, chest pain, and stomach pain and any other associated symptoms. His relative states that he last saw a Neurologist in April. They states that he sees them every 4 months. His next appointment is at the end of July. Dr. Marcell Anger sent out a Physical Therapist in the fall. His relatives suggest that he has [facility] for his Physical Therapy.   Past Medical History  Diagnosis Date  . Colonic polyp   . Diverticulosis of colon   . HLD (hyperlipidemia)   . HTN (hypertension)   . Primary pulmonary HTN   . Benign hypertensive heart disease without heart failure   . CAD (coronary artery disease)   . Pneumonia   . Cancer     hx of skin ca  . Arthritis   . DEMENTIA   . Allergy   . Blood transfusion without reported diagnosis   . Cataract   .  Syncope and collapse   . S/P biopsy     positive  prostatee biospy  . Progressive gait disorder   . Parkinsonism   . Aortic stenosis    Current Outpatient Prescriptions on File Prior to Visit  Medication Sig Dispense Refill  . aspirin EC 325 MG tablet Take 325 mg by mouth daily.      . carbidopa-levodopa (SINEMET IR) 25-100 MG per tablet Take 40 minutes before or after a meal.  1.5 tablets by mouth. Take last dose at 6 PM not later.  Rec : intake times , 7 AM , 12 and 5 PM.  160 tablet  11  . donepezil (ARICEPT) 23 MG TABS tablet Take 1 tablet (23 mg total) by mouth at bedtime.  30 tablet  5  . Prenatal MV-Min-Fe Fum-FA-DHA (CENTRUM SPECIALIST PRENATAL PO) Take 1 capsule by mouth daily.      . simvastatin (ZOCOR) 40 MG tablet Take 40 mg by mouth every evening.      [name] ZETIA 10 MG tablet take 1 tablet by mouth once daily  30 tablet  11   No current facility-administered medications on file prior to visit.   Allergies  Allergen Reactions  .  Namenda [Memantine Hcl]     Patient developed pancytopenia which resolved on discontinuation of the drug  . Pollen Extract     Review of Systems  Constitutional: Negative for fever and chills.  Eyes: Positive for discharge (crusting of both lower lids).  Cardiovascular: Negative for chest pain.  Gastrointestinal: Negative for nausea, vomiting and abdominal pain.  Genitourinary: Positive for decreased urine volume.       Bright red blood in the underwear.  Neurological: Negative for headaches.      BP 102/68  Pulse 83  Temp(Src) 98 F (36.7 C) (Oral)  Resp 17  SpO2 98%  Objective:   Physical Exam  Nursing note and vitals reviewed. Constitutional: He is oriented to person, place, and time. He appears well-developed and well-nourished. No distress.  HENT:  Head: Normocephalic and atraumatic.  Eyes: EOM are normal.  Crusting on both eyes.  Neck: Neck supple. No tracheal deviation present.  Cardiovascular: Normal rate.   Murmur  heard. Slow irregular rhythm with a 3/6 systolic murmur.  Pulmonary/Chest: Effort normal. No respiratory distress.  Abdominal: Soft. There is no tenderness.  Neurological: He is alert and oriented to person, place, and time.  Parkinsonian features with cog wheeling of the upper and lower extremities.   Skin: Skin is warm and dry.  Psychiatric: He has a normal mood and affect. His behavior is normal.   Results for orders placed in visit on 10/31/13  POCT CBC      Result Value Ref Range   WBC 7.9  4.6 - 10.2 K/uL   Lymph, poc 1.6  0.6 - 3.4   POC LYMPH PERCENT 19.7  10 - 50 %L   MID (cbc) 0.7  0 - 0.9   POC MID % 9.2  0 - 12 %M   POC Granulocyte 5.6  2 - 6.9   Granulocyte percent 71.1  37 - 80 %G   RBC 4.28 (*) 4.69 - 6.13 M/uL   Hemoglobin 13.3 (*) 14.1 - 18.1 g/dL   HCT, POC 41.9 (*) 43.5 - 53.7 %   MCV 97.9 (*) 80 - 97 fL   MCH, POC 31.1  27 - 31.2 pg   MCHC 31.7 (*) 31.8 - 35.4 g/dL   RDW, POC 13.8     Platelet Count, POC 119 (*) 142 - 424 K/uL   MPV 10.8  0 - 99.8 fL        Assessment & Plan:  I personally performed the services described in this documentation, which was scribed in my presence. The recorded information has been reviewed and is accurate.  The patient having worsening of his parkinsonism. Will increase his Parkinson medications as previously instructed by Dr. Brett Fairy. Referral made for physical therapy. [facility]. We had a long discussion that the patient is reaching the point where he will not be to able to be cared for at home. The son who was in attendance was in agreement. His wife is very resistant to any care facility. She was having a difficult time accepting that she may not be able to care for him at home

## 2013-11-01 ENCOUNTER — Other Ambulatory Visit: Payer: Self-pay | Admitting: *Deleted

## 2013-11-01 DIAGNOSIS — G20 Parkinson's disease: Secondary | ICD-10-CM

## 2013-11-01 DIAGNOSIS — R269 Unspecified abnormalities of gait and mobility: Secondary | ICD-10-CM

## 2013-11-01 LAB — COMPREHENSIVE METABOLIC PANEL
ALK PHOS: 76 U/L (ref 39–117)
ALT: <8 U/L (ref 0–53)
AST: 14 U/L (ref 0–37)
Albumin: 3.6 g/dL (ref 3.5–5.2)
BILIRUBIN TOTAL: 1.3 mg/dL — AB (ref 0.2–1.2)
BUN: 28 mg/dL — ABNORMAL HIGH (ref 6–23)
CO2: 28 mEq/L (ref 19–32)
Calcium: 8.8 mg/dL (ref 8.4–10.5)
Chloride: 105 mEq/L (ref 96–112)
Creat: 1.3 mg/dL (ref 0.50–1.35)
GLUCOSE: 95 mg/dL (ref 70–99)
Potassium: 4.2 mEq/L (ref 3.5–5.3)
SODIUM: 140 meq/L (ref 135–145)
Total Protein: 6.1 g/dL (ref 6.0–8.3)

## 2013-11-01 LAB — MAGNESIUM: Magnesium: 2.3 mg/dL (ref 1.5–2.5)

## 2013-11-03 ENCOUNTER — Telehealth: Payer: Self-pay | Admitting: Radiology

## 2013-11-03 ENCOUNTER — Other Ambulatory Visit: Payer: Self-pay | Admitting: Radiology

## 2013-11-03 DIAGNOSIS — R4182 Altered mental status, unspecified: Secondary | ICD-10-CM

## 2013-11-03 DIAGNOSIS — R29898 Other symptoms and signs involving the musculoskeletal system: Secondary | ICD-10-CM

## 2013-11-03 DIAGNOSIS — R269 Unspecified abnormalities of gait and mobility: Secondary | ICD-10-CM

## 2013-11-03 NOTE — Telephone Encounter (Signed)
Hi Betsy, hope you are well, Dr Everlene Farrier has ordered PT (eval and treat) and nursing eval(safety eval) on this patient, can you arrange?

## 2013-11-05 NOTE — Telephone Encounter (Signed)
I have gotten notice back from [facility] they do not participate with patients insurance, [facility]. [facility] has already contacted patients wife. Dr Everlene Farrier aware, patients wife has also been advised of his lab results.

## 2013-11-06 ENCOUNTER — Telehealth: Payer: Self-pay

## 2013-11-06 NOTE — Telephone Encounter (Signed)
Verbal order to continue pt in five or six weeks.   Finished up with the service, looking for a Verbal order to continue pt for five or six weeks  Arthur Lane    [phone]  Dr Everlene Farrier

## 2013-11-09 NOTE — Telephone Encounter (Signed)
No msg °

## 2013-11-11 NOTE — Telephone Encounter (Signed)
Please continue PT for the next 6 weeks

## 2013-11-11 NOTE — Telephone Encounter (Signed)
Advised Lashaunda of VO.

## 2013-11-30 ENCOUNTER — Other Ambulatory Visit: Payer: Self-pay | Admitting: Cardiology

## 2013-12-04 ENCOUNTER — Other Ambulatory Visit: Payer: Self-pay | Admitting: Cardiology

## 2013-12-09 ENCOUNTER — Ambulatory Visit: Payer: Medicare Other | Admitting: Nurse Practitioner

## 2013-12-17 ENCOUNTER — Telehealth: Payer: Self-pay

## 2013-12-17 NOTE — Telephone Encounter (Signed)
Patients daughter is concerned with patients care. She says her mother is caring for the patient, however, she is getting older and the daughter feels that the patient may need home health services. She wants to speak with Dr Everlene Farrier.

## 2013-12-17 NOTE — Telephone Encounter (Signed)
Please advise 

## 2013-12-18 ENCOUNTER — Ambulatory Visit: Payer: Medicare Other | Admitting: Nurse Practitioner

## 2013-12-18 ENCOUNTER — Telehealth: Payer: Self-pay

## 2013-12-18 NOTE — Telephone Encounter (Signed)
Order was sent for [facility] and [facility] [place]. Signed and faxed back. Scanned.

## 2013-12-18 NOTE — Telephone Encounter (Signed)
Dicie Beam, son, called and I gave him advice from Dr Everlene Farrier that in his opinion pt needs round the clock care and would be best for pt to be cared for in an facility. Son and daughter are in agreement that this would probably be best, but pt and wife, Pamala Hurry are not. Emerson Monte name and number of [facility]/Sharon Rosana Hoes [phone]) that Dr Everlene Farrier had recommended, cared for one of his other patients, but discussed would probably have to be paid OOP and most people can not afford round the clock care in home. Gave son name of [facility] to help assess and coordinate search for appropriate LTC and also advised that we can refer a MSW to see him through [facility] who is going to the home for PT. Mitzi Hansen stated he will talk with his sister and CB if referral is needed. Thanked [place] for the help, and I mailed him 2 HIPPA forms for him to have pt and wife update w/both children's names.

## 2013-12-18 NOTE — Telephone Encounter (Signed)
LMOM for daughter to CB. Explain she is not on pt's HIPPA so we are not allowed to discuss his care with her. Advise that pt's son was at last OV w/pt in June and she may talk with him about the visit and what was discussed, or Dr Everlene Farrier would be more than happy to discuss this with her if she can get herself added to pt's HIPPA form.

## 2013-12-18 NOTE — Telephone Encounter (Signed)
I agree with the daughter. At the last visit Mrs. Belenkes was resistant to date been in a care facility. I am in full agreement they cannot care for themselves at home but she was not willing to take this step to get him in a care facility. I would be happy to discuss this with her I think it is important that they talk to the parents about a care facility. When we spoke at the last office visit they would not agree to go. Her brother was at the appointment and he was also in agreement they needed to go to a facility

## 2013-12-18 NOTE — Telephone Encounter (Signed)
Daughter CB and understood problem w/HIPPA. She stated that she thinks they need to get more help into the home for a couple periods per day and to help w/ADLs for pt and would like resources Dr Everlene Farrier could recommend. She will have her brother who is on HIPPA call this afternoon to discuss. Dr Everlene Farrier, is there any HH for aides that you would recommend ([facility] can't do this long term and RN or PT would also have to cont). It sounds like they are talking about an aide to come like [employer], etc. Do you even think this is an option or do you think pt needs round the clock care/ [facility] vs Skilled nursing?

## 2013-12-20 ENCOUNTER — Other Ambulatory Visit: Payer: Self-pay | Admitting: Emergency Medicine

## 2013-12-20 ENCOUNTER — Ambulatory Visit (INDEPENDENT_AMBULATORY_CARE_PROVIDER_SITE_OTHER): Payer: Medicare Other | Admitting: Emergency Medicine

## 2013-12-20 VITALS — BP 134/60 | HR 61 | Temp 97.3°F | Resp 20

## 2013-12-20 DIAGNOSIS — D649 Anemia, unspecified: Secondary | ICD-10-CM

## 2013-12-20 DIAGNOSIS — R531 Weakness: Secondary | ICD-10-CM

## 2013-12-20 DIAGNOSIS — R269 Unspecified abnormalities of gait and mobility: Secondary | ICD-10-CM

## 2013-12-20 DIAGNOSIS — R5381 Other malaise: Secondary | ICD-10-CM

## 2013-12-20 DIAGNOSIS — G20 Parkinson's disease: Secondary | ICD-10-CM

## 2013-12-20 DIAGNOSIS — R5383 Other fatigue: Secondary | ICD-10-CM

## 2013-12-20 LAB — POCT CBC
GRANULOCYTE PERCENT: 78.3 % (ref 37–80)
HCT, POC: 38.4 % — AB (ref 43.5–53.7)
Hemoglobin: 12.1 g/dL — AB (ref 14.1–18.1)
Lymph, poc: 1.2 (ref 0.6–3.4)
MCH, POC: 30.3 pg (ref 27–31.2)
MCHC: 31.4 g/dL — AB (ref 31.8–35.4)
MCV: 96.2 fL (ref 80–97)
MID (cbc): 0.7 (ref 0–0.9)
MPV: 8.7 fL (ref 0–99.8)
POC Granulocyte: 6.8 (ref 2–6.9)
POC LYMPH PERCENT: 13.7 %L (ref 10–50)
POC MID %: 8 %M (ref 0–12)
Platelet Count, POC: 109 10*3/uL — AB (ref 142–424)
RBC: 3.98 M/uL — AB (ref 4.69–6.13)
RDW, POC: 17.8 %
WBC: 8.7 10*3/uL (ref 4.6–10.2)

## 2013-12-20 LAB — COMPREHENSIVE METABOLIC PANEL
ALBUMIN: 3.7 g/dL (ref 3.5–5.2)
ALT: <8 U/L (ref 0–53)
AST: 23 U/L (ref 0–37)
Alkaline Phosphatase: 66 U/L (ref 39–117)
BUN: 26 mg/dL — AB (ref 6–23)
CALCIUM: 8.7 mg/dL (ref 8.4–10.5)
CHLORIDE: 104 meq/L (ref 96–112)
CO2: 29 mEq/L (ref 19–32)
Creat: 1 mg/dL (ref 0.50–1.35)
Glucose, Bld: 91 mg/dL (ref 70–99)
POTASSIUM: 3.9 meq/L (ref 3.5–5.3)
Sodium: 140 mEq/L (ref 135–145)
Total Bilirubin: 3 mg/dL — ABNORMAL HIGH (ref 0.2–1.2)
Total Protein: 5.7 g/dL — ABNORMAL LOW (ref 6.0–8.3)

## 2013-12-20 LAB — MAGNESIUM: MAGNESIUM: 2.1 mg/dL (ref 1.5–2.5)

## 2013-12-20 LAB — CK: Total CK: 330 U/L — ABNORMAL HIGH (ref 7–232)

## 2013-12-20 LAB — TSH: TSH: 0.718 u[IU]/mL (ref 0.350–4.500)

## 2013-12-20 MED ORDER — RANITIDINE HCL 150 MG PO TABS: 150.0000 mg | ORAL_TABLET | Freq: Two times a day (BID) | ORAL | Status: AC

## 2013-12-20 NOTE — Patient Instructions (Signed)
Anemia, Nonspecific Anemia is a condition in which the concentration of red blood cells or hemoglobin in the blood is below normal. Hemoglobin is a substance in red blood cells that carries oxygen to the tissues of the body. Anemia results in not enough oxygen reaching these tissues.  CAUSES  Common causes of anemia include:   Excessive bleeding. Bleeding may be internal or external. This includes excessive bleeding from periods (in women) or from the intestine.   Poor nutrition.   Chronic kidney, thyroid, and liver disease.  Bone marrow disorders that decrease red blood cell production.  Cancer and treatments for cancer.  HIV, AIDS, and their treatments.  Spleen problems that increase red blood cell destruction.  Blood disorders.  Excess destruction of red blood cells due to infection, medicines, and autoimmune disorders. SIGNS AND SYMPTOMS   Minor weakness.   Dizziness.   Headache.  Palpitations.   Shortness of breath, especially with exercise.   Paleness.  Cold sensitivity.  Indigestion.  Nausea.  Difficulty sleeping.  Difficulty concentrating. Symptoms may occur suddenly or they may develop slowly.  DIAGNOSIS  Additional blood tests are often needed. These help your health care provider determine the best treatment. Your health care provider will check your stool for blood and look for other causes of blood loss.  TREATMENT  Treatment varies depending on the cause of the anemia. Treatment can include:   Supplements of iron, vitamin B12, or folic acid.   Hormone medicines.   A blood transfusion. This may be needed if blood loss is severe.   Hospitalization. This may be needed if there is significant continual blood loss.   Dietary changes.  Spleen removal. HOME CARE INSTRUCTIONS Keep all follow-up appointments. It often takes many weeks to correct anemia, and having your health care provider check on your condition and your response to  treatment is very important. SEEK IMMEDIATE MEDICAL CARE IF:   You develop extreme weakness, shortness of breath, or chest pain.   You become dizzy or have trouble concentrating.  You develop heavy vaginal bleeding.   You develop a rash.   You have bloody or black, tarry stools.   You faint.   You vomit up blood.   You vomit repeatedly.   You have abdominal pain.  You have a fever or persistent symptoms for more than 2-3 days.   You have a fever and your symptoms suddenly get worse.   You are dehydrated.  MAKE SURE YOU:  Understand these instructions.  Will watch your condition.  Will get help right away if you are not doing well or get worse. Document Released: 06/07/2004 Document Revised: 12/31/2012 Document Reviewed: 10/24/2012 ExitCare Patient Information 2015 [employer]. This information is not intended to replace advice given to you by your health care provider. Make sure you discuss any questions you have with your health care provider.  

## 2013-12-20 NOTE — Progress Notes (Addendum)
   Subjective:    Patient ID: Arthur Lane, male    DOB: August 06, 1923, 80 y.o.   MRN: [id number]  HPI Patient carries a diagnosis of parkinsonism and over the last 2-3 days has had increasing difficulty with gait and ambulation. He needs maximal assistance to get him out of a chair. He has not fallen. He has not had any difficulty HEENT he states he is able to move his arms and legs without difficulty. There have been no mentation changes.   Review of Systems     Objective:   Physical Exam patient has typical Parkinsonian features. Cranial nerves are intact. Chest clear heart regular rate 2/6 systolic murmur at the base of the heart abdomen soft nontender extremities are without edema. He does have rigidity of both the upper and lower extremities. He is sitting in the chair and is unable to get to a standing position. His grip strength is symmetrical.  Results for orders placed in visit on 12/20/13  POCT CBC      Result Value Ref Range   WBC 8.7  4.6 - 10.2 K/uL   Lymph, poc 1.2  0.6 - 3.4   POC LYMPH PERCENT 13.7  10 - 50 %L   MID (cbc) 0.7  0 - 0.9   POC MID % 8.0  0 - 12 %M   POC Granulocyte 6.8  2 - 6.9   Granulocyte percent 78.3  37 - 80 %G   RBC 3.98 (*) 4.69 - 6.13 M/uL   Hemoglobin 12.1 (*) 14.1 - 18.1 g/dL   HCT, POC 38.4 (*) 43.5 - 53.7 %   MCV 96.2  80 - 97 fL   MCH, POC 30.3  27 - 31.2 pg   MCHC 31.4 (*) 31.8 - 35.4 g/dL   RDW, POC 17.8     Platelet Count, POC 109 (*) 142 - 424 K/uL   MPV 8.7  0 - 99.8 fL       Assessment & Plan:  Patient presents with progressive parkinsonism difficulty with memory worsening difficulty with gait. They're given a number to call to get evaluation for a chronic care facility. Physical therapy will continue. Baseline lab work was done for treatable causes of gait disorder as a source of his worsening problems need to be sure he is taking a multivitamin with iron one a day. He did have a positive stool last month. He will continue on a  multivitamin with iron one a day. I also started him on Zantac 150 twice a day in case he is having some GI irritation from aspirin . Recheck CBC 1 month.` This patient absolutely needs a hospital bed. He is not able to get out of a seated position. He is not able to change his position once he is in bed secondary to his parkinsonism.

## 2013-12-21 ENCOUNTER — Telehealth: Payer: Self-pay

## 2013-12-21 DIAGNOSIS — I509 Heart failure, unspecified: Secondary | ICD-10-CM

## 2013-12-21 DIAGNOSIS — G20 Parkinson's disease: Secondary | ICD-10-CM

## 2013-12-21 LAB — BILIRUBIN, FRACTIONATED(TOT/DIR/INDIR)
Bilirubin, Direct: 0.5 mg/dL — ABNORMAL HIGH (ref 0.0–0.3)
Indirect Bilirubin: 2.5 mg/dL — ABNORMAL HIGH (ref 0.2–1.2)
Total Bilirubin: 3 mg/dL — ABNORMAL HIGH (ref 0.2–1.2)

## 2013-12-21 NOTE — Telephone Encounter (Signed)
Faxed order to [facility].  Pt notified.

## 2013-12-21 NOTE — Telephone Encounter (Signed)
[name] STATES HER HUSBAND WAS SEEN THE OTHER DAY BY DR DAUB AND SHE FORGOT TO ASK FOR A REFERRAL SO HE CAN GET A HOSPITAL BED, THEY ARE IN DIRE NEED OF ONE AND WOULD LIKE TO HAVE IT TODAY. PLEASE FAX THE REFERRAL TO [facility] TO [phone] AND YOU MAY REACH PT AT [phone] IF NEEDED

## 2013-12-22 ENCOUNTER — Telehealth: Payer: Self-pay

## 2013-12-22 NOTE — Telephone Encounter (Signed)
I am went into his visit on Sunday and documented he is not able to get from a seated to standing position. Due to his parkinsonism he is not able to change position in the bed and therefore needs a hospital bed

## 2013-12-22 NOTE — Telephone Encounter (Signed)
Arthur Lane, pt's wife called and wanted to let you know some of the reasons why they need a hospital bed. She says that their bed is high up off the ground and it is very difficult to get him sitting up so that he can get off of it. Wants to know if you can send this info to [facility]. Thanks

## 2013-12-22 NOTE — Telephone Encounter (Signed)
Pt needs the last OV note addended to state the patient needs the hospital bed because patient can not get out of bed easily and she is not able to care for the pt while he is in a regular bed.

## 2013-12-23 MED ORDER — UNABLE TO FIND: Status: AC

## 2013-12-23 NOTE — Telephone Encounter (Signed)
Printed new Rx for Semi-electric hospital bed as instr'd by Gwinda Passe at [facility] and faxed it and addended OV notes to [facility] DME dept w/confirmation.

## 2013-12-23 NOTE — Telephone Encounter (Signed)
Pamala Hurry-  Here is the note from Dr. Everlene Farrier to complete that authorization for the hospital bed.

## 2013-12-24 NOTE — Progress Notes (Signed)
Thank you for sharing  this lab result. CD

## 2013-12-25 ENCOUNTER — Telehealth: Payer: Self-pay

## 2013-12-25 NOTE — Telephone Encounter (Signed)
Pt's wife Arthur Lane called in and wanted to know if Dr.Daub could prescribe anything for her husband for being anxious when home health aides come to assist him. She says he doesn't want them to touch or mess with him. She also says that he has a spot near his tailbone that may become a bed sore and wanted a ointment of some kind to put on it. She can be reached @ [phone]. Thank you

## 2013-12-25 NOTE — Telephone Encounter (Signed)
Patient's spouse requesting medication to be called in for her husband for a rash that he has. Pamala Hurry his wife requesting if possible to be able to have it called in tonight. [facility] [address] [place]. Patients wife Pamala Hurry call back number is [phone] call to confirm with patients wife Pamala Hurry

## 2013-12-26 ENCOUNTER — Telehealth: Payer: Self-pay

## 2013-12-26 MED ORDER — MUPIROCIN 2 % EX OINT: 1.0000 "application " | TOPICAL_OINTMENT | Freq: Two times a day (BID) | CUTANEOUS | Status: AC

## 2013-12-26 NOTE — Telephone Encounter (Signed)
Pt's wife is also requesting something for Arthur Lane's anxiety. He is having a difficult time with the caregivers and is very anxious and difficult to care for.

## 2013-12-26 NOTE — Addendum Note (Signed)
Addended by: Constance Goltz on: 12/26/2013 11:13 AM   Modules accepted: Orders

## 2013-12-26 NOTE — Telephone Encounter (Signed)
Please call and Bactroban ointment to apply to the irritated area twice a day. 2 refills.

## 2013-12-26 NOTE — Telephone Encounter (Signed)
Sent in

## 2013-12-28 ENCOUNTER — Telehealth: Payer: Self-pay | Admitting: Neurology

## 2013-12-28 NOTE — Telephone Encounter (Signed)
Patient's wife calling to state that patient is very anxious when caregivers are around and his PCP suggested that he take a Xanax or some sort of sedative but wanted to consult with Dr. Brett Fairy first. Please return call to patient's wife and advise, they use the [facility] on [address] In [place].

## 2013-12-28 NOTE — Telephone Encounter (Signed)
Spoke to wife. She understands to call the neurologist. She will call us back to notify Dr. Everlene Farrier which medication Nicolaos will be put on.

## 2013-12-28 NOTE — Telephone Encounter (Signed)
She will have to speak with the neurologist and see what is safe for him to take with his parkinsonian medicines for his agitation

## 2013-12-29 MED ORDER — ALPRAZOLAM 0.5 MG PO TABS: ORAL_TABLET | ORAL | Status: AC

## 2013-12-29 NOTE — Telephone Encounter (Signed)
The rx was faxed to [facility], patient's wife wanted to pick up urgently.

## 2013-12-29 NOTE — Telephone Encounter (Signed)
Spoke to wife,  Arthur Lane,   There is a new concern- Arthur Lane had been doing well for several weeks until Friday , when he was complaining of constipation and suddenly lost his ability to ambulate - He sank to his knee and couldn't get up, the health care aids can't touch him without him being in pain. This is new. The [facility] have seen Dr Everlene Farrier and he advised to call us , RV for 28th Aug.  May need to be seen for reflexes and sensory level , as this could have been a sudden spinal cord lesion. Wife suggested PD and dementia to be the underlying cause, but something set it off. UTI?   I will call in 0.5 mg Xanax for him to take 1/2 tab before the home health aids touch him and dress him , etc. As he can barely tolerate it now. CD

## 2014-01-03 ENCOUNTER — Telehealth: Payer: Self-pay

## 2014-01-03 NOTE — Telephone Encounter (Signed)
Katharine Look called again wanting to know if Dr. Everlene Farrier could send an order to [facility] to draw labs at home instead of coming to the office. Per Katharine Look, please call Mitzi Hansen back with info since he is on privacy form.

## 2014-01-03 NOTE — Telephone Encounter (Signed)
The patient's daughter and son called to see if there was a way to have their father's blood drawn at home and then brought to the office as the patient is very weak and does not do well during outings.  The patient's daughter Katharine Look [phone]) and his son Mitzi Hansen [phone]) request return call to see how/if this is possible.

## 2014-01-04 NOTE — Telephone Encounter (Signed)
FYI  Dr. Everlene Farrier- pt will have CK level redrawn.

## 2014-01-04 NOTE — Telephone Encounter (Signed)
Per last OV/Labs pt is due to have his CK drawn.  If [facility] is able to do a blood draw this would be fine. Need info to send order. [facility] [phone] fax [phone] Butch Penny at [facility] is going to check and see if the order can be completed from home. PT is scheduled to go out today. They are going to find out if a nurse will be able to stop over just for the blood work.   Alba Destine.

## 2014-01-05 ENCOUNTER — Telehealth: Payer: Self-pay | Admitting: *Deleted

## 2014-01-05 NOTE — Telephone Encounter (Signed)
FYI- Patient is not feeling well, and his wife cancelled his appointment for tomorrow.  Dr. Everlene Farrier is sending someone to the house to do some bloodwork on him.

## 2014-01-06 ENCOUNTER — Ambulatory Visit: Payer: Self-pay | Admitting: Neurology

## 2014-01-06 ENCOUNTER — Ambulatory Visit: Payer: Medicare Other | Admitting: Nurse Practitioner

## 2014-01-19 ENCOUNTER — Other Ambulatory Visit: Payer: Self-pay | Admitting: Nurse Practitioner

## 2014-01-20 ENCOUNTER — Telehealth: Payer: Self-pay

## 2014-01-20 ENCOUNTER — Telehealth: Payer: Self-pay | Admitting: Neurology

## 2014-01-20 ENCOUNTER — Encounter: Payer: Self-pay | Admitting: Emergency Medicine

## 2014-01-20 DIAGNOSIS — Z7409 Other reduced mobility: Secondary | ICD-10-CM

## 2014-01-20 NOTE — Telephone Encounter (Signed)
RICK FROM [facility] WANTED TO GIVE THE READINGS ON PT. BP-140/104, PULSE 76, RESP 16, TEMP 98.8\ IF YOU CARE TO CALL HIM, HIS NUMBER IS [phone]

## 2014-01-20 NOTE — Telephone Encounter (Signed)
Patient's spouse requesting Rx to help with sleep.  Stated ALPRAZolam (XANAX) 0.5 MG tablet made patient hyper and had hallucinations, only took 1/2 of dosage recommended dosage.  Please call anytime and may leave detailed message on voicemail.

## 2014-01-20 NOTE — Telephone Encounter (Signed)
MRS, [place] STATES THEY REALLY NEED TO KNOW PT'S LAB RESULTS HE IS AT [place] [phone]

## 2014-01-21 MED ORDER — RISPERIDONE 0.25 MG PO TABS: 0.2500 mg | ORAL_TABLET | Freq: Every day | ORAL | Status: AC

## 2014-01-21 NOTE — Telephone Encounter (Signed)
Called and spoke to Arthur Lane - he did not retake bp at his visit. He is the PT that only goes to the home for 30 minutes twice a week.  Called and spoke to wife- she is going to take Pt BP and return call.

## 2014-01-21 NOTE — Telephone Encounter (Signed)
Recent lab results scanned to chart was pt CK 34-WNL. Please advise if there are any other lab results we are waiting for to result to pt.

## 2014-01-21 NOTE — Telephone Encounter (Signed)
The last CK that was done was normal

## 2014-01-21 NOTE — Telephone Encounter (Signed)
Spoke to wife  -We could try risperdal or seroquel.  i opted for lowest possible dose of risperdal at PM time. Sorry that xanax caused paradoxical effect.  Take resperidol 30 minutes prior to bedtime.  i advised that this medication may cause excessive sleepiness and that the tablet may be split if this is happening  None of these medication has been FDA approved for use in dementia patient.CD   Her husband needs now a hospital bed, his in home therapist cannot longer move him , he is rapidly declining. He just turned 65- he may require hospice help now. His wife agrees and wanted me to call [facility] and ask for there input.   i called and left VM.  CD

## 2014-01-21 NOTE — Telephone Encounter (Signed)
Pt called back with bp readings of 145/93 and 144/89, would like for sarah to please call her back asap.

## 2014-01-21 NOTE — Telephone Encounter (Signed)
Pt has not had high blood pressures like this recently. Pt has stopped taking simvastatin for the blood work that he had done. Pt wife is wanting to know that pt may restart this medication. Please advise.  Pt only had CK drawn.

## 2014-01-21 NOTE — Telephone Encounter (Signed)
Spoke to wife- she is going to start the simvastatin again and call Dr. Stanford Breed as suggested by Dr. Everlene Farrier.

## 2014-01-21 NOTE — Telephone Encounter (Signed)
I would advise they notified Dr. Jacalyn Lefevre office and he can decide what he feels would be the safest blood pressure medication for him to be on

## 2014-01-22 NOTE — Telephone Encounter (Signed)
Spoke to Martelle from [facility] relayed that the doctor LVM last night for services for patient.  She will return call when she checks patient's chart and referral information.

## 2014-01-25 NOTE — Telephone Encounter (Signed)
I spoke to two different people from [place], [place] a PT and Manuela Schwartz a Publishing copy.  The PT feels the patient is rapidly declining and he is completely dependent.  They don't feel he is in need of any nursing care, but that hospice would be a good option at this point.  (They believe the patient's wife would like nursing evaluation because then she could receive an aide to come in to help with ADLS.)  I'm not sure if Hospice would provide any of those services.

## 2014-01-25 NOTE — Telephone Encounter (Signed)
Spouse calling back to check status of PT.  Please call anytime and may leave detailed message on voicemail.

## 2014-01-26 ENCOUNTER — Telehealth: Payer: Self-pay | Admitting: Neurology

## 2014-01-26 MED ORDER — CIPROFLOXACIN 500 MG/5ML (10%) PO SUSR: 500.0000 mg | Freq: Two times a day (BID) | ORAL | Status: AC

## 2014-01-26 NOTE — Telephone Encounter (Signed)
Patient's wife returning call to Butch Penny, please call and advise.

## 2014-01-26 NOTE — Telephone Encounter (Signed)
The wife called. The patient has had some foul-smelling urine, blood in urine, likely has a urinary tract infection. I will call in Cipro for him.

## 2014-01-26 NOTE — Telephone Encounter (Signed)
Hospice care would be initiated and can provide care. Too- But I believe that PT is not part of it.  Nursing evaluation initiated for home health . CD

## 2014-01-26 NOTE — Telephone Encounter (Signed)
Patient's wife calling again, states that she is not going to be available from 2:30 to 3:30 and if she could get a call back before then.

## 2014-01-27 ENCOUNTER — Telehealth: Payer: Self-pay | Admitting: Neurology

## 2014-01-27 NOTE — Telephone Encounter (Signed)
Per Dr. Brett Fairy, Hospice and [facility] has been notified and will proceed with sending a nurse to patient's home.  I have called patient's wife and relayed that hospice should be notifying her and will try to get a nurse out today if possible if not tomorrow.  Patient's wife was very thankful to [place] and Dr. Brett Fairy for help.

## 2014-01-27 NOTE — Telephone Encounter (Signed)
Please see telephone note 01-27-14.

## 2014-01-27 NOTE — Telephone Encounter (Signed)
Patient's wife calling to speak with Butch Penny to state that she needs a hospice nurse for patient as soon as possible, please return call and advise.

## 2014-01-29 ENCOUNTER — Telehealth: Payer: Self-pay | Admitting: Neurology

## 2014-01-29 MED ORDER — ASPIRIN 81 MG PO CHEW: 81.0000 mg | CHEWABLE_TABLET | Freq: Once | ORAL | Status: AC

## 2014-01-29 NOTE — Telephone Encounter (Signed)
Dear Butch Penny,  Can discontinue oral meds,  urine sample only if patient has discomfort or pain form a possible UTI.  I give verbal order. CD

## 2014-01-29 NOTE — Telephone Encounter (Signed)
Patient's hospice nurse Maura calling to get some verbal orders and clarification, please return call and advise.

## 2014-01-29 NOTE — Telephone Encounter (Addendum)
Dr. Brett Fairy has called Cristela Blue the nurse and given orders.  Also left message for Cristela Blue to see if she received all the verbal orders she requested.  Asked her to return my call.

## 2014-01-29 NOTE — Telephone Encounter (Signed)
Spoke to [place] the hospice nurse.  She has gone out for her initial visit and is requesting some verbal orders from the doctor.  The first is for a verbal for a DNR and then their doctor can initiate.  The second is since the patient isn't swallowing she would like a verbal to discontinue Zantac, simvastatin, Zetia and Multivitamin, also the generic Aricept can't be crushed and she would like to discontinue that also.  She would like the Aspirin 325mg  changed to the chewable baby aspirin 81mg .  And finally the wife wanted to know if he needs to give a urine sample since there is still blood in his urine.  Again she stated since Hospice does not recommend aggressive treatment she wanted to know if this is necessary, he is still on liquid Cipro.

## 2014-02-01 NOTE — Telephone Encounter (Signed)
Spoke to [place] the hospice nurse, she relayed that the patient is declining fast and is now unresponsive.  She has received all the verbal orders from Dr. Brett Fairy. She will keep [place] informed.

## 2014-02-02 ENCOUNTER — Telehealth: Payer: Self-pay | Admitting: *Deleted

## 2014-02-02 NOTE — Telephone Encounter (Signed)
I gave my condolences to [name]. CD

## 2014-02-02 NOTE — Telephone Encounter (Signed)
Pt passed away today.  Is Dr. Brett Fairy signing death certificate?  Pt to be cremated.  Needs call back asap, may LM (she may be with family).  [phone]

## 2014-02-11 DEATH — deceased

## 2014-04-19 ENCOUNTER — Telehealth: Payer: Self-pay | Admitting: Radiology

## 2014-04-19 NOTE — Telephone Encounter (Signed)
Renay called patient to see if he wants flu shot, he is deceased.

## 2014-10-25 ENCOUNTER — Other Ambulatory Visit: Payer: Self-pay

## 2014-11-08 ENCOUNTER — Other Ambulatory Visit: Payer: Self-pay

## 2015-02-15 ENCOUNTER — Encounter: Payer: Self-pay | Admitting: Emergency Medicine
